# Patient Record
Sex: Male | Born: 1935
Health system: Southern US, Community
[De-identification: ages and names within clinical notes are randomized; demographics above are authoritative.]

## PROBLEM LIST (undated history)

## (undated) DIAGNOSIS — I10 Essential (primary) hypertension: Secondary | ICD-10-CM

## (undated) DIAGNOSIS — E785 Hyperlipidemia, unspecified: Secondary | ICD-10-CM

## (undated) DIAGNOSIS — I2510 Atherosclerotic heart disease of native coronary artery without angina pectoris: Secondary | ICD-10-CM

## (undated) DIAGNOSIS — J449 Chronic obstructive pulmonary disease, unspecified: Secondary | ICD-10-CM

## (undated) DIAGNOSIS — I5022 Chronic systolic (congestive) heart failure: Secondary | ICD-10-CM

## (undated) DIAGNOSIS — M6282 Rhabdomyolysis: Secondary | ICD-10-CM

## (undated) DIAGNOSIS — J45909 Unspecified asthma, uncomplicated: Secondary | ICD-10-CM

## (undated) DIAGNOSIS — E119 Type 2 diabetes mellitus without complications: Secondary | ICD-10-CM

## (undated) DIAGNOSIS — I255 Ischemic cardiomyopathy: Secondary | ICD-10-CM

## (undated) HISTORY — DX: Ischemic cardiomyopathy: I25.5

## (undated) HISTORY — DX: Unspecified asthma, uncomplicated: J45.909

## (undated) HISTORY — DX: Rhabdomyolysis: M62.82

## (undated) HISTORY — DX: Hyperlipidemia, unspecified: E78.5

## (undated) HISTORY — DX: Chronic systolic (congestive) heart failure: I50.22

## (undated) HISTORY — DX: Type 2 diabetes mellitus without complications: E11.9

## (undated) HISTORY — DX: Atherosclerotic heart disease of native coronary artery without angina pectoris: I25.10

## (undated) HISTORY — DX: Essential (primary) hypertension: I10

## (undated) HISTORY — DX: Chronic obstructive pulmonary disease, unspecified: J44.9

---

## 2008-07-27 ENCOUNTER — Emergency Department: Payer: Self-pay | Admitting: Emergency Medicine

## 2008-07-28 ENCOUNTER — Emergency Department: Payer: Self-pay | Admitting: Emergency Medicine

## 2009-05-28 ENCOUNTER — Inpatient Hospital Stay: Payer: Self-pay | Admitting: Internal Medicine

## 2010-04-02 ENCOUNTER — Ambulatory Visit: Payer: Self-pay | Admitting: Internal Medicine

## 2010-06-04 ENCOUNTER — Ambulatory Visit: Payer: Self-pay | Admitting: Gastroenterology

## 2010-06-06 LAB — PATHOLOGY REPORT

## 2010-07-30 ENCOUNTER — Ambulatory Visit: Payer: Self-pay | Admitting: Internal Medicine

## 2011-05-28 DIAGNOSIS — I2510 Atherosclerotic heart disease of native coronary artery without angina pectoris: Secondary | ICD-10-CM | POA: Diagnosis not present

## 2011-05-28 DIAGNOSIS — I5022 Chronic systolic (congestive) heart failure: Secondary | ICD-10-CM | POA: Diagnosis not present

## 2011-05-28 DIAGNOSIS — I059 Rheumatic mitral valve disease, unspecified: Secondary | ICD-10-CM | POA: Diagnosis not present

## 2011-05-28 DIAGNOSIS — E782 Mixed hyperlipidemia: Secondary | ICD-10-CM | POA: Diagnosis not present

## 2011-06-03 ENCOUNTER — Other Ambulatory Visit: Payer: Self-pay | Admitting: Orthopedic Surgery

## 2011-06-03 DIAGNOSIS — M6740 Ganglion, unspecified site: Secondary | ICD-10-CM | POA: Diagnosis not present

## 2011-06-03 DIAGNOSIS — R29898 Other symptoms and signs involving the musculoskeletal system: Secondary | ICD-10-CM | POA: Diagnosis not present

## 2011-06-03 LAB — BODY FLUID CELL COUNT WITH DIFFERENTIAL
Basophil: 0 %
Lymphocytes: 53 %
Neutrophils: 47 %

## 2011-06-03 LAB — SYNOVIAL FLUID, CRYSTAL: Crystals, Joint Fluid: NONE SEEN

## 2011-07-03 DIAGNOSIS — L03119 Cellulitis of unspecified part of limb: Secondary | ICD-10-CM | POA: Diagnosis not present

## 2011-07-03 DIAGNOSIS — L02419 Cutaneous abscess of limb, unspecified: Secondary | ICD-10-CM | POA: Diagnosis not present

## 2011-08-03 DIAGNOSIS — J449 Chronic obstructive pulmonary disease, unspecified: Secondary | ICD-10-CM | POA: Diagnosis not present

## 2011-08-03 DIAGNOSIS — I43 Cardiomyopathy in diseases classified elsewhere: Secondary | ICD-10-CM | POA: Diagnosis not present

## 2011-08-10 DIAGNOSIS — I43 Cardiomyopathy in diseases classified elsewhere: Secondary | ICD-10-CM | POA: Diagnosis not present

## 2011-08-10 DIAGNOSIS — E782 Mixed hyperlipidemia: Secondary | ICD-10-CM | POA: Diagnosis not present

## 2011-08-10 DIAGNOSIS — I10 Essential (primary) hypertension: Secondary | ICD-10-CM | POA: Diagnosis not present

## 2011-12-08 DIAGNOSIS — E559 Vitamin D deficiency, unspecified: Secondary | ICD-10-CM | POA: Diagnosis not present

## 2011-12-08 DIAGNOSIS — E119 Type 2 diabetes mellitus without complications: Secondary | ICD-10-CM | POA: Diagnosis not present

## 2011-12-08 DIAGNOSIS — I10 Essential (primary) hypertension: Secondary | ICD-10-CM | POA: Diagnosis not present

## 2011-12-08 DIAGNOSIS — I43 Cardiomyopathy in diseases classified elsewhere: Secondary | ICD-10-CM | POA: Diagnosis not present

## 2011-12-08 DIAGNOSIS — Z0000 Encounter for general adult medical examination without abnormal findings: Secondary | ICD-10-CM | POA: Diagnosis not present

## 2011-12-08 DIAGNOSIS — R5381 Other malaise: Secondary | ICD-10-CM | POA: Diagnosis not present

## 2011-12-08 DIAGNOSIS — J449 Chronic obstructive pulmonary disease, unspecified: Secondary | ICD-10-CM | POA: Diagnosis not present

## 2011-12-08 DIAGNOSIS — E782 Mixed hyperlipidemia: Secondary | ICD-10-CM | POA: Diagnosis not present

## 2011-12-16 DIAGNOSIS — E782 Mixed hyperlipidemia: Secondary | ICD-10-CM | POA: Diagnosis not present

## 2011-12-16 DIAGNOSIS — I2510 Atherosclerotic heart disease of native coronary artery without angina pectoris: Secondary | ICD-10-CM | POA: Diagnosis not present

## 2011-12-16 DIAGNOSIS — I5022 Chronic systolic (congestive) heart failure: Secondary | ICD-10-CM | POA: Diagnosis not present

## 2012-02-01 DIAGNOSIS — M545 Low back pain: Secondary | ICD-10-CM | POA: Diagnosis not present

## 2012-02-01 DIAGNOSIS — J449 Chronic obstructive pulmonary disease, unspecified: Secondary | ICD-10-CM | POA: Diagnosis not present

## 2012-02-01 DIAGNOSIS — K5909 Other constipation: Secondary | ICD-10-CM | POA: Diagnosis not present

## 2012-04-12 DIAGNOSIS — J449 Chronic obstructive pulmonary disease, unspecified: Secondary | ICD-10-CM | POA: Diagnosis not present

## 2012-04-12 DIAGNOSIS — I43 Cardiomyopathy in diseases classified elsewhere: Secondary | ICD-10-CM | POA: Diagnosis not present

## 2012-04-12 DIAGNOSIS — E119 Type 2 diabetes mellitus without complications: Secondary | ICD-10-CM | POA: Diagnosis not present

## 2012-04-12 DIAGNOSIS — E559 Vitamin D deficiency, unspecified: Secondary | ICD-10-CM | POA: Diagnosis not present

## 2012-04-12 DIAGNOSIS — R5381 Other malaise: Secondary | ICD-10-CM | POA: Diagnosis not present

## 2012-04-12 DIAGNOSIS — J309 Allergic rhinitis, unspecified: Secondary | ICD-10-CM | POA: Diagnosis not present

## 2012-04-12 DIAGNOSIS — R7989 Other specified abnormal findings of blood chemistry: Secondary | ICD-10-CM | POA: Diagnosis not present

## 2012-04-12 DIAGNOSIS — D518 Other vitamin B12 deficiency anemias: Secondary | ICD-10-CM | POA: Diagnosis not present

## 2012-04-12 DIAGNOSIS — E782 Mixed hyperlipidemia: Secondary | ICD-10-CM | POA: Diagnosis not present

## 2012-04-12 DIAGNOSIS — I2510 Atherosclerotic heart disease of native coronary artery without angina pectoris: Secondary | ICD-10-CM | POA: Diagnosis not present

## 2012-06-16 DIAGNOSIS — E782 Mixed hyperlipidemia: Secondary | ICD-10-CM | POA: Diagnosis not present

## 2012-06-16 DIAGNOSIS — R0602 Shortness of breath: Secondary | ICD-10-CM | POA: Diagnosis not present

## 2012-06-16 DIAGNOSIS — I5022 Chronic systolic (congestive) heart failure: Secondary | ICD-10-CM | POA: Diagnosis not present

## 2012-06-16 DIAGNOSIS — I2510 Atherosclerotic heart disease of native coronary artery without angina pectoris: Secondary | ICD-10-CM | POA: Diagnosis not present

## 2012-06-23 DIAGNOSIS — I5020 Unspecified systolic (congestive) heart failure: Secondary | ICD-10-CM | POA: Diagnosis not present

## 2012-08-08 DIAGNOSIS — Z006 Encounter for examination for normal comparison and control in clinical research program: Secondary | ICD-10-CM | POA: Diagnosis not present

## 2012-08-08 DIAGNOSIS — E119 Type 2 diabetes mellitus without complications: Secondary | ICD-10-CM | POA: Diagnosis not present

## 2012-08-08 DIAGNOSIS — R05 Cough: Secondary | ICD-10-CM | POA: Diagnosis not present

## 2012-08-08 DIAGNOSIS — R0602 Shortness of breath: Secondary | ICD-10-CM | POA: Diagnosis not present

## 2012-08-08 DIAGNOSIS — R238 Other skin changes: Secondary | ICD-10-CM | POA: Diagnosis not present

## 2012-08-08 DIAGNOSIS — R209 Unspecified disturbances of skin sensation: Secondary | ICD-10-CM | POA: Diagnosis not present

## 2012-08-08 DIAGNOSIS — J449 Chronic obstructive pulmonary disease, unspecified: Secondary | ICD-10-CM | POA: Diagnosis not present

## 2012-08-08 DIAGNOSIS — M79609 Pain in unspecified limb: Secondary | ICD-10-CM | POA: Diagnosis not present

## 2012-08-08 DIAGNOSIS — M25569 Pain in unspecified knee: Secondary | ICD-10-CM | POA: Diagnosis not present

## 2012-08-09 ENCOUNTER — Ambulatory Visit: Payer: Self-pay | Admitting: Physician Assistant

## 2012-08-09 DIAGNOSIS — IMO0002 Reserved for concepts with insufficient information to code with codable children: Secondary | ICD-10-CM | POA: Diagnosis not present

## 2012-08-09 DIAGNOSIS — M1710 Unilateral primary osteoarthritis, unspecified knee: Secondary | ICD-10-CM | POA: Diagnosis not present

## 2012-08-09 DIAGNOSIS — R0602 Shortness of breath: Secondary | ICD-10-CM | POA: Diagnosis not present

## 2012-08-09 DIAGNOSIS — J9819 Other pulmonary collapse: Secondary | ICD-10-CM | POA: Diagnosis not present

## 2012-08-09 DIAGNOSIS — M25569 Pain in unspecified knee: Secondary | ICD-10-CM | POA: Diagnosis not present

## 2012-08-10 DIAGNOSIS — E119 Type 2 diabetes mellitus without complications: Secondary | ICD-10-CM | POA: Diagnosis not present

## 2012-08-10 DIAGNOSIS — M25569 Pain in unspecified knee: Secondary | ICD-10-CM | POA: Diagnosis not present

## 2012-08-10 DIAGNOSIS — E559 Vitamin D deficiency, unspecified: Secondary | ICD-10-CM | POA: Diagnosis not present

## 2012-08-10 DIAGNOSIS — I2510 Atherosclerotic heart disease of native coronary artery without angina pectoris: Secondary | ICD-10-CM | POA: Diagnosis not present

## 2012-08-10 DIAGNOSIS — J309 Allergic rhinitis, unspecified: Secondary | ICD-10-CM | POA: Diagnosis not present

## 2012-08-10 DIAGNOSIS — J449 Chronic obstructive pulmonary disease, unspecified: Secondary | ICD-10-CM | POA: Diagnosis not present

## 2012-08-10 DIAGNOSIS — I10 Essential (primary) hypertension: Secondary | ICD-10-CM | POA: Diagnosis not present

## 2012-08-10 DIAGNOSIS — M1710 Unilateral primary osteoarthritis, unspecified knee: Secondary | ICD-10-CM | POA: Diagnosis not present

## 2012-08-17 DIAGNOSIS — M1710 Unilateral primary osteoarthritis, unspecified knee: Secondary | ICD-10-CM | POA: Diagnosis not present

## 2012-08-29 DIAGNOSIS — K219 Gastro-esophageal reflux disease without esophagitis: Secondary | ICD-10-CM | POA: Diagnosis not present

## 2012-08-29 DIAGNOSIS — J441 Chronic obstructive pulmonary disease with (acute) exacerbation: Secondary | ICD-10-CM | POA: Diagnosis not present

## 2012-08-29 DIAGNOSIS — J449 Chronic obstructive pulmonary disease, unspecified: Secondary | ICD-10-CM | POA: Diagnosis not present

## 2012-08-29 DIAGNOSIS — I10 Essential (primary) hypertension: Secondary | ICD-10-CM | POA: Diagnosis not present

## 2012-08-29 DIAGNOSIS — I6529 Occlusion and stenosis of unspecified carotid artery: Secondary | ICD-10-CM | POA: Diagnosis not present

## 2012-08-29 DIAGNOSIS — E785 Hyperlipidemia, unspecified: Secondary | ICD-10-CM | POA: Diagnosis not present

## 2012-08-29 DIAGNOSIS — E119 Type 2 diabetes mellitus without complications: Secondary | ICD-10-CM | POA: Diagnosis not present

## 2012-10-12 DIAGNOSIS — Z79899 Other long term (current) drug therapy: Secondary | ICD-10-CM | POA: Diagnosis not present

## 2012-10-12 DIAGNOSIS — E119 Type 2 diabetes mellitus without complications: Secondary | ICD-10-CM | POA: Diagnosis not present

## 2012-10-12 DIAGNOSIS — J438 Other emphysema: Secondary | ICD-10-CM | POA: Diagnosis not present

## 2012-10-12 DIAGNOSIS — I10 Essential (primary) hypertension: Secondary | ICD-10-CM | POA: Diagnosis not present

## 2012-10-12 DIAGNOSIS — R0602 Shortness of breath: Secondary | ICD-10-CM | POA: Diagnosis not present

## 2012-10-12 DIAGNOSIS — Z125 Encounter for screening for malignant neoplasm of prostate: Secondary | ICD-10-CM | POA: Diagnosis not present

## 2012-10-12 DIAGNOSIS — E782 Mixed hyperlipidemia: Secondary | ICD-10-CM | POA: Diagnosis not present

## 2012-12-09 DIAGNOSIS — B359 Dermatophytosis, unspecified: Secondary | ICD-10-CM | POA: Diagnosis not present

## 2012-12-09 DIAGNOSIS — E782 Mixed hyperlipidemia: Secondary | ICD-10-CM | POA: Diagnosis not present

## 2012-12-09 DIAGNOSIS — E1149 Type 2 diabetes mellitus with other diabetic neurological complication: Secondary | ICD-10-CM | POA: Diagnosis not present

## 2012-12-09 DIAGNOSIS — I10 Essential (primary) hypertension: Secondary | ICD-10-CM | POA: Diagnosis not present

## 2012-12-09 DIAGNOSIS — E559 Vitamin D deficiency, unspecified: Secondary | ICD-10-CM | POA: Diagnosis not present

## 2012-12-09 DIAGNOSIS — J449 Chronic obstructive pulmonary disease, unspecified: Secondary | ICD-10-CM | POA: Diagnosis not present

## 2012-12-12 DIAGNOSIS — E1149 Type 2 diabetes mellitus with other diabetic neurological complication: Secondary | ICD-10-CM | POA: Diagnosis not present

## 2012-12-12 DIAGNOSIS — B351 Tinea unguium: Secondary | ICD-10-CM | POA: Diagnosis not present

## 2012-12-12 DIAGNOSIS — L851 Acquired keratosis [keratoderma] palmaris et plantaris: Secondary | ICD-10-CM | POA: Diagnosis not present

## 2012-12-12 DIAGNOSIS — G909 Disorder of the autonomic nervous system, unspecified: Secondary | ICD-10-CM | POA: Diagnosis not present

## 2013-01-25 DIAGNOSIS — R7989 Other specified abnormal findings of blood chemistry: Secondary | ICD-10-CM | POA: Diagnosis not present

## 2013-02-06 DIAGNOSIS — J449 Chronic obstructive pulmonary disease, unspecified: Secondary | ICD-10-CM | POA: Diagnosis not present

## 2013-02-06 DIAGNOSIS — J438 Other emphysema: Secondary | ICD-10-CM | POA: Diagnosis not present

## 2013-02-06 DIAGNOSIS — B359 Dermatophytosis, unspecified: Secondary | ICD-10-CM | POA: Diagnosis not present

## 2013-04-16 IMAGING — CR DG CHEST 2V
1 series · 3 of 3 positions shown · non-contrast
Comparison: none

REASON FOR EXAM: shortness of breath
COMMENTS:

PROCEDURE:     KDR - KDXR CHEST PA (OR AP) AND LAT  - August 09, 2012  [DATE]
RESULT:     Comparison: 07/30/2010

[Series 1: pa · 0.17mm/px · 3 of 3 slices shown]
[im 1/3]
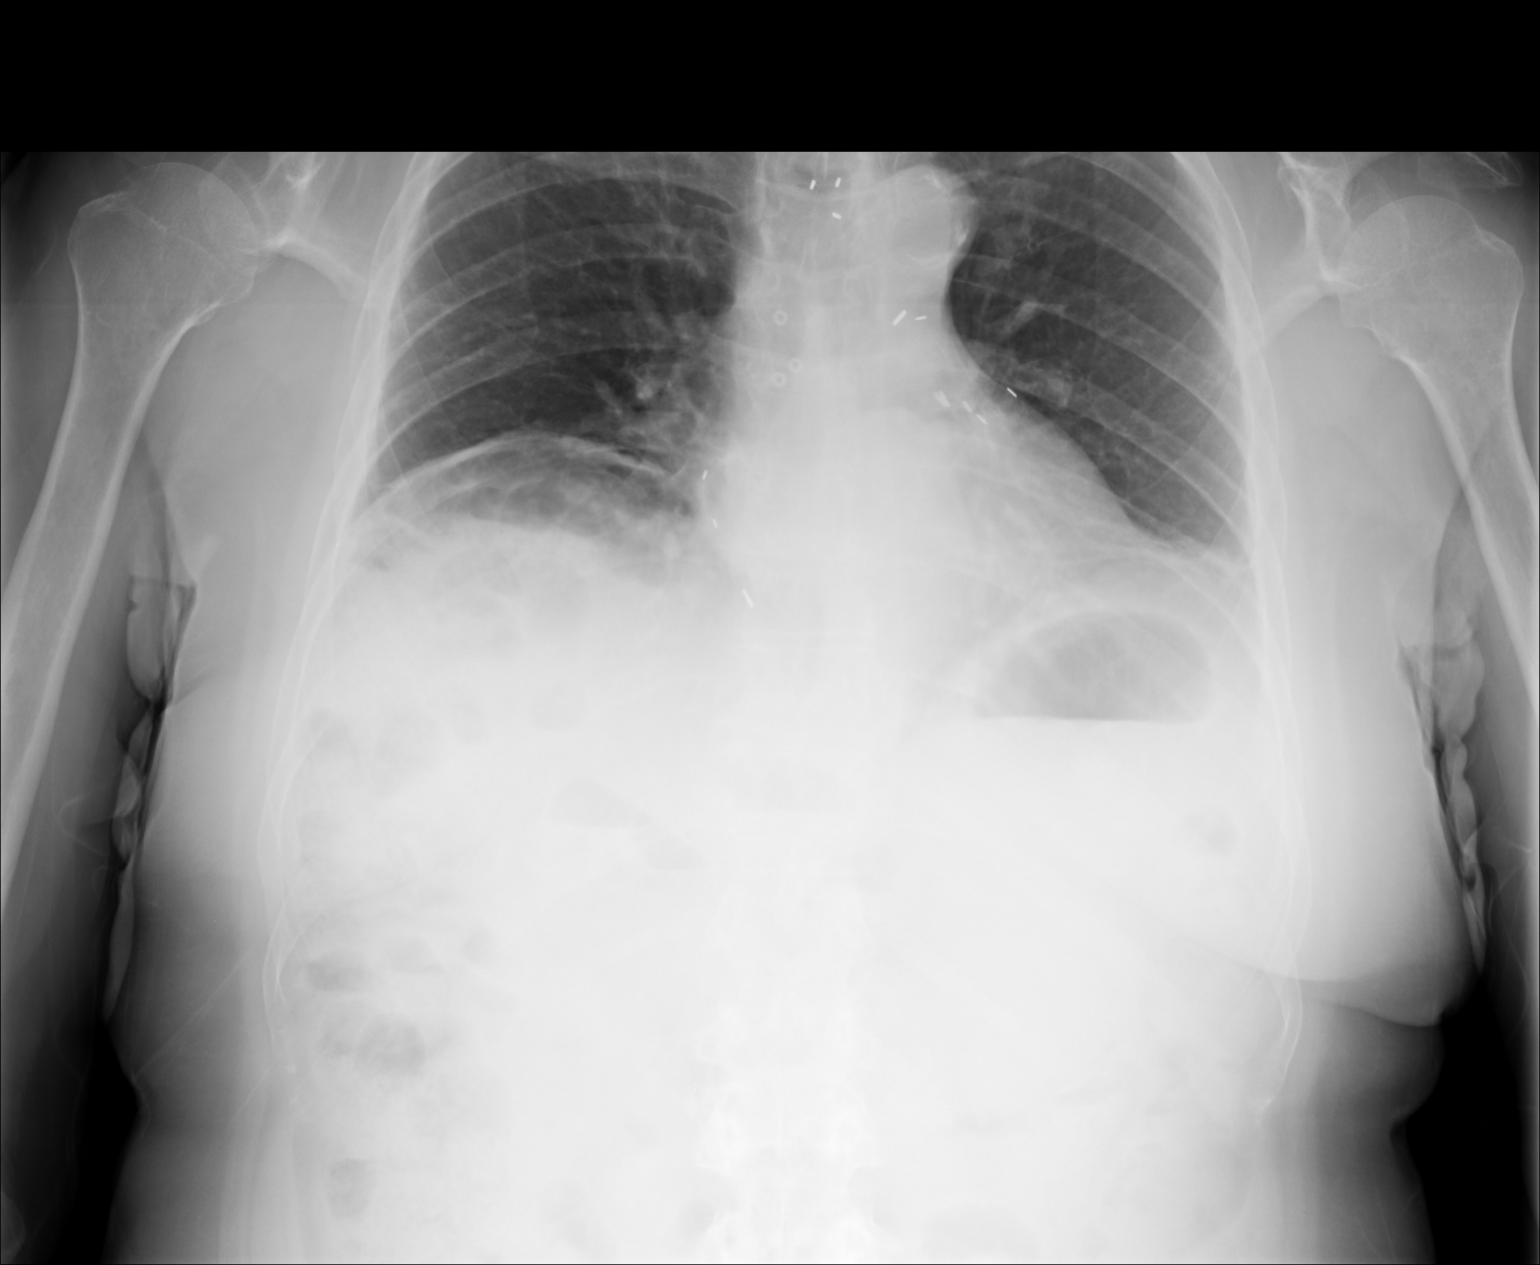
[im 2/3]
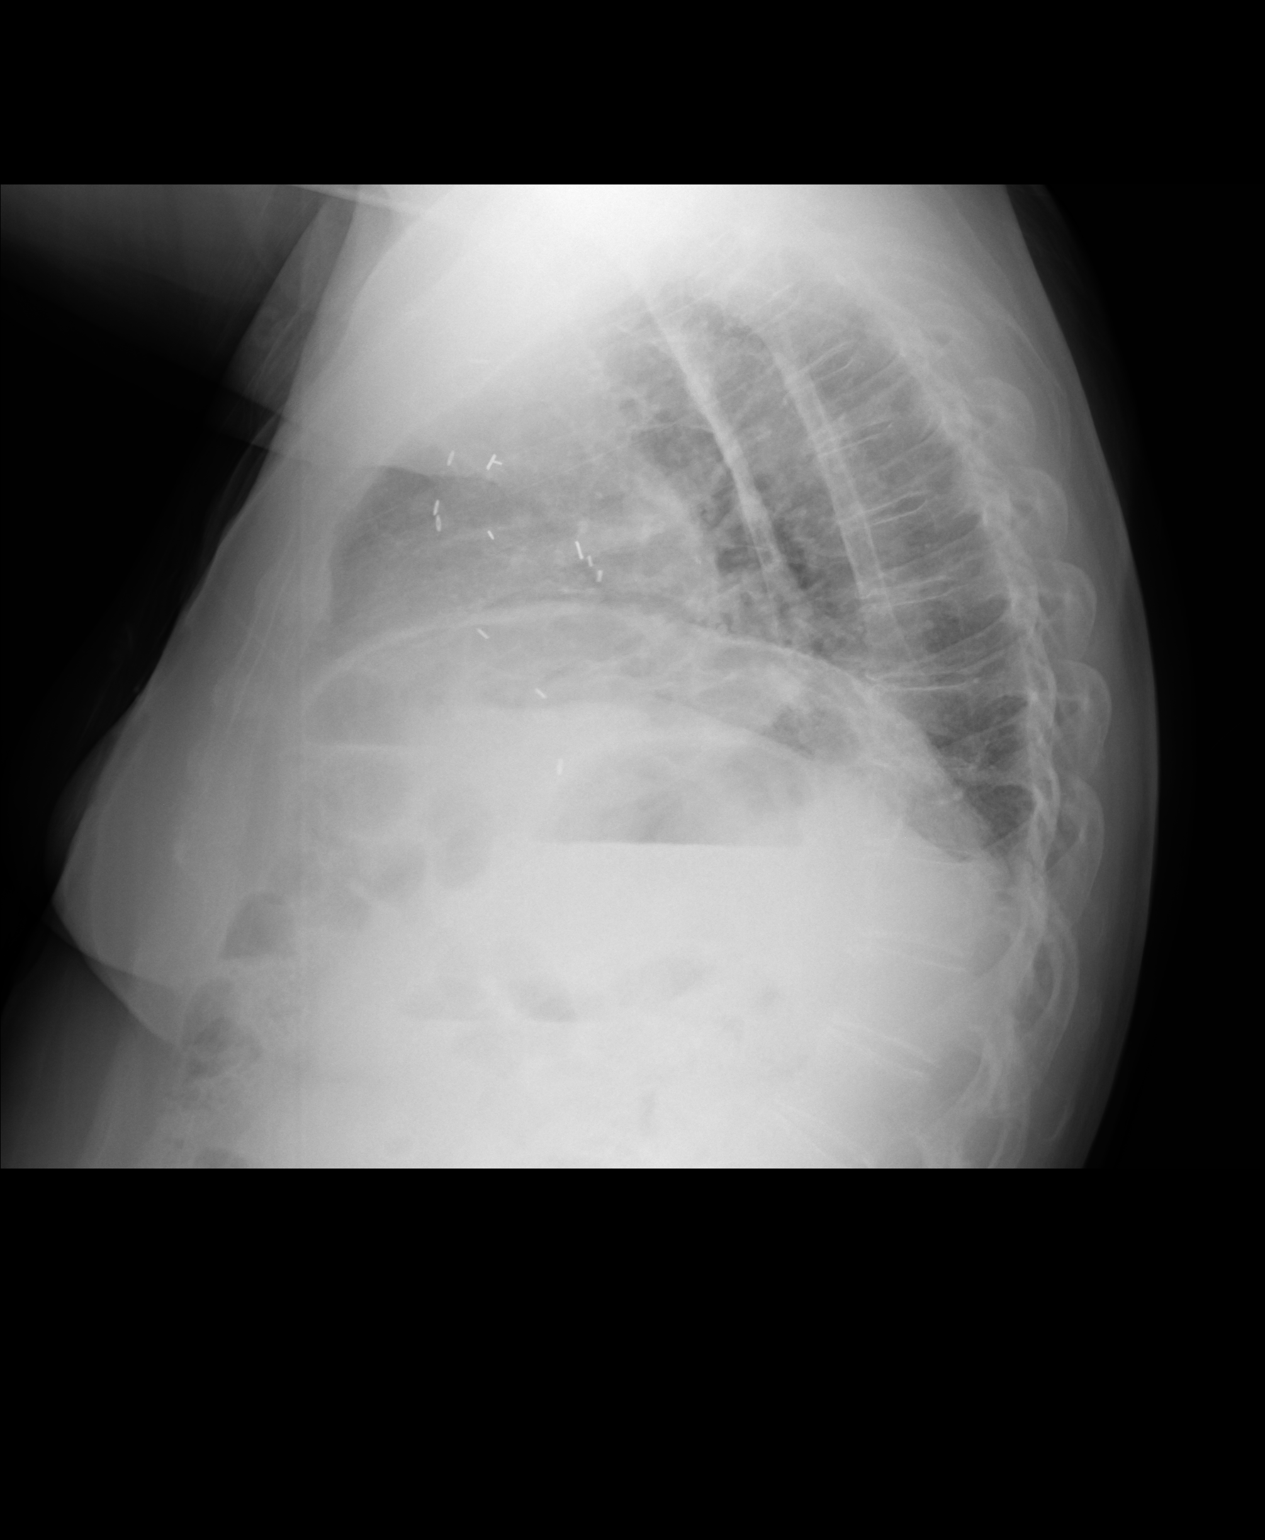
[im 3/3]
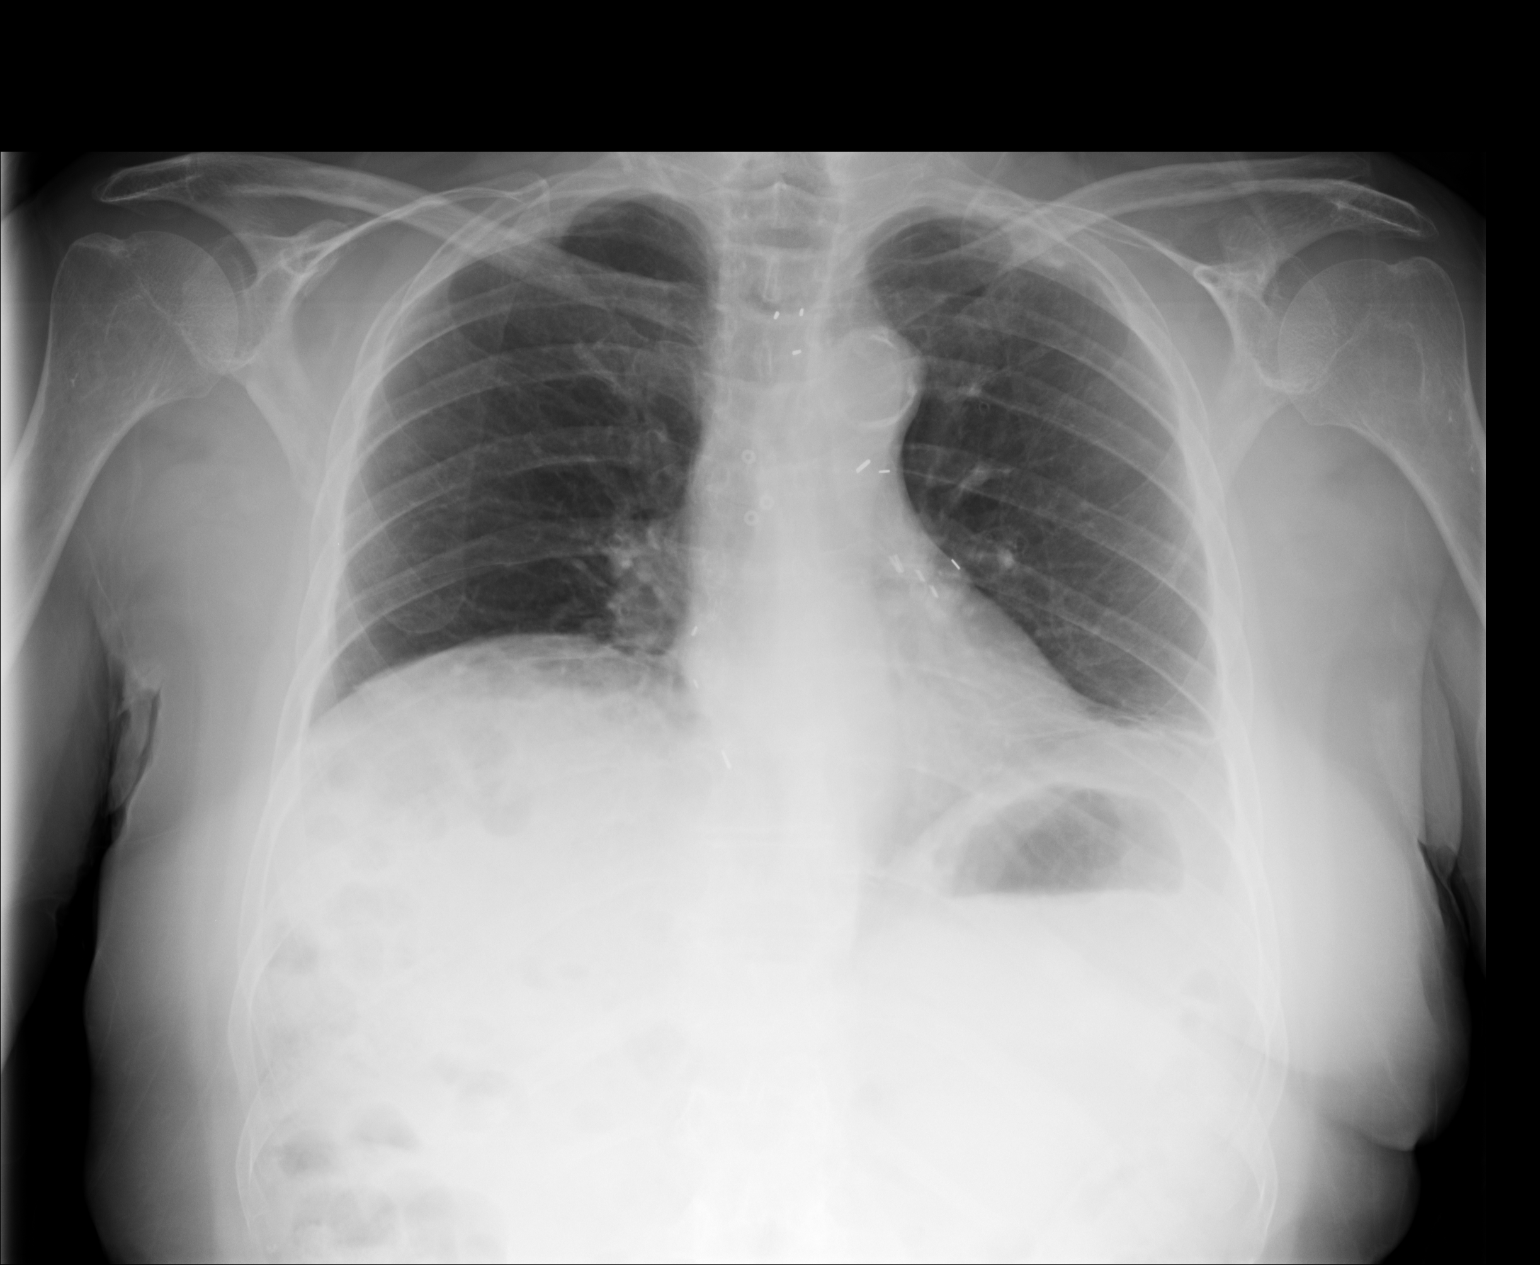

[3 of 3 positions shown; findings below may reference images not displayed]

FINDINGS: PA and lateral chest radiographs are provided. No low lung volumes. There is
left basilar atelectasis. There is evidence of prior CABG. There is no focal
parenchymal opacity, pleural effusion, or pneumothorax. The heart and
mediastinum are unremarkable.  The osseous structures are unremarkable.
IMPRESSION: No acute disease of the che[REDACTED]

## 2013-08-07 DIAGNOSIS — J438 Other emphysema: Secondary | ICD-10-CM | POA: Diagnosis not present

## 2013-08-07 DIAGNOSIS — J449 Chronic obstructive pulmonary disease, unspecified: Secondary | ICD-10-CM | POA: Diagnosis not present

## 2013-09-12 DIAGNOSIS — I428 Other cardiomyopathies: Secondary | ICD-10-CM | POA: Diagnosis not present

## 2013-09-12 DIAGNOSIS — Z79899 Other long term (current) drug therapy: Secondary | ICD-10-CM | POA: Diagnosis not present

## 2013-09-12 DIAGNOSIS — E039 Hypothyroidism, unspecified: Secondary | ICD-10-CM | POA: Diagnosis not present

## 2013-09-12 DIAGNOSIS — E119 Type 2 diabetes mellitus without complications: Secondary | ICD-10-CM | POA: Diagnosis not present

## 2013-09-12 DIAGNOSIS — E782 Mixed hyperlipidemia: Secondary | ICD-10-CM | POA: Diagnosis not present

## 2013-09-12 DIAGNOSIS — E1149 Type 2 diabetes mellitus with other diabetic neurological complication: Secondary | ICD-10-CM | POA: Diagnosis not present

## 2013-09-12 DIAGNOSIS — J449 Chronic obstructive pulmonary disease, unspecified: Secondary | ICD-10-CM | POA: Diagnosis not present

## 2013-09-12 DIAGNOSIS — I10 Essential (primary) hypertension: Secondary | ICD-10-CM | POA: Diagnosis not present

## 2013-09-12 DIAGNOSIS — B351 Tinea unguium: Secondary | ICD-10-CM | POA: Diagnosis not present

## 2013-10-27 DIAGNOSIS — B351 Tinea unguium: Secondary | ICD-10-CM | POA: Diagnosis not present

## 2013-10-27 DIAGNOSIS — Z79899 Other long term (current) drug therapy: Secondary | ICD-10-CM | POA: Diagnosis not present

## 2013-10-27 DIAGNOSIS — E1149 Type 2 diabetes mellitus with other diabetic neurological complication: Secondary | ICD-10-CM | POA: Diagnosis not present

## 2013-11-04 ENCOUNTER — Emergency Department: Payer: Self-pay | Admitting: Emergency Medicine

## 2013-11-04 DIAGNOSIS — S63509A Unspecified sprain of unspecified wrist, initial encounter: Secondary | ICD-10-CM | POA: Diagnosis not present

## 2013-11-04 DIAGNOSIS — M25519 Pain in unspecified shoulder: Secondary | ICD-10-CM | POA: Diagnosis not present

## 2013-11-04 DIAGNOSIS — S46909A Unspecified injury of unspecified muscle, fascia and tendon at shoulder and upper arm level, unspecified arm, initial encounter: Secondary | ICD-10-CM | POA: Diagnosis not present

## 2013-11-04 DIAGNOSIS — E785 Hyperlipidemia, unspecified: Secondary | ICD-10-CM | POA: Diagnosis not present

## 2013-11-04 DIAGNOSIS — M25539 Pain in unspecified wrist: Secondary | ICD-10-CM | POA: Diagnosis not present

## 2013-11-04 DIAGNOSIS — Z79899 Other long term (current) drug therapy: Secondary | ICD-10-CM | POA: Diagnosis not present

## 2013-11-04 DIAGNOSIS — S4980XA Other specified injuries of shoulder and upper arm, unspecified arm, initial encounter: Secondary | ICD-10-CM | POA: Diagnosis not present

## 2013-11-04 DIAGNOSIS — S59909A Unspecified injury of unspecified elbow, initial encounter: Secondary | ICD-10-CM | POA: Diagnosis not present

## 2013-11-04 DIAGNOSIS — Z760 Encounter for issue of repeat prescription: Secondary | ICD-10-CM | POA: Diagnosis not present

## 2013-11-04 DIAGNOSIS — I10 Essential (primary) hypertension: Secondary | ICD-10-CM | POA: Diagnosis not present

## 2013-11-04 DIAGNOSIS — S40019A Contusion of unspecified shoulder, initial encounter: Secondary | ICD-10-CM | POA: Diagnosis not present

## 2013-12-05 DIAGNOSIS — J438 Other emphysema: Secondary | ICD-10-CM | POA: Diagnosis not present

## 2013-12-05 DIAGNOSIS — J449 Chronic obstructive pulmonary disease, unspecified: Secondary | ICD-10-CM | POA: Diagnosis not present

## 2013-12-15 DIAGNOSIS — Z79899 Other long term (current) drug therapy: Secondary | ICD-10-CM | POA: Diagnosis not present

## 2013-12-15 DIAGNOSIS — E1149 Type 2 diabetes mellitus with other diabetic neurological complication: Secondary | ICD-10-CM | POA: Diagnosis not present

## 2013-12-15 DIAGNOSIS — B351 Tinea unguium: Secondary | ICD-10-CM | POA: Diagnosis not present

## 2014-02-08 ENCOUNTER — Encounter: Payer: Self-pay | Admitting: Nurse Practitioner

## 2014-02-08 ENCOUNTER — Other Ambulatory Visit: Payer: Self-pay | Admitting: Nurse Practitioner

## 2014-02-08 ENCOUNTER — Inpatient Hospital Stay: Payer: Self-pay | Admitting: Internal Medicine

## 2014-02-08 DIAGNOSIS — I248 Other forms of acute ischemic heart disease: Secondary | ICD-10-CM | POA: Diagnosis present

## 2014-02-08 DIAGNOSIS — I4729 Other ventricular tachycardia: Secondary | ICD-10-CM | POA: Diagnosis not present

## 2014-02-08 DIAGNOSIS — J209 Acute bronchitis, unspecified: Secondary | ICD-10-CM | POA: Diagnosis present

## 2014-02-08 DIAGNOSIS — I2589 Other forms of chronic ischemic heart disease: Secondary | ICD-10-CM | POA: Diagnosis present

## 2014-02-08 DIAGNOSIS — R059 Cough, unspecified: Secondary | ICD-10-CM | POA: Diagnosis not present

## 2014-02-08 DIAGNOSIS — E119 Type 2 diabetes mellitus without complications: Secondary | ICD-10-CM | POA: Diagnosis not present

## 2014-02-08 DIAGNOSIS — J45901 Unspecified asthma with (acute) exacerbation: Secondary | ICD-10-CM | POA: Diagnosis not present

## 2014-02-08 DIAGNOSIS — I10 Essential (primary) hypertension: Secondary | ICD-10-CM | POA: Diagnosis not present

## 2014-02-08 DIAGNOSIS — R748 Abnormal levels of other serum enzymes: Secondary | ICD-10-CM | POA: Diagnosis not present

## 2014-02-08 DIAGNOSIS — E785 Hyperlipidemia, unspecified: Secondary | ICD-10-CM | POA: Diagnosis present

## 2014-02-08 DIAGNOSIS — R350 Frequency of micturition: Secondary | ICD-10-CM | POA: Diagnosis present

## 2014-02-08 DIAGNOSIS — Z951 Presence of aortocoronary bypass graft: Secondary | ICD-10-CM | POA: Diagnosis not present

## 2014-02-08 DIAGNOSIS — I472 Ventricular tachycardia, unspecified: Secondary | ICD-10-CM | POA: Diagnosis not present

## 2014-02-08 DIAGNOSIS — I509 Heart failure, unspecified: Secondary | ICD-10-CM | POA: Diagnosis not present

## 2014-02-08 DIAGNOSIS — R0602 Shortness of breath: Secondary | ICD-10-CM | POA: Diagnosis not present

## 2014-02-08 DIAGNOSIS — Z7982 Long term (current) use of aspirin: Secondary | ICD-10-CM | POA: Diagnosis not present

## 2014-02-08 DIAGNOSIS — I5021 Acute systolic (congestive) heart failure: Secondary | ICD-10-CM | POA: Diagnosis present

## 2014-02-08 DIAGNOSIS — R05 Cough: Secondary | ICD-10-CM | POA: Diagnosis not present

## 2014-02-08 DIAGNOSIS — R7989 Other specified abnormal findings of blood chemistry: Secondary | ICD-10-CM | POA: Diagnosis not present

## 2014-02-08 LAB — URINALYSIS, COMPLETE
BACTERIA: NONE SEEN
BILIRUBIN, UR: NEGATIVE
BLOOD: NEGATIVE
Glucose,UR: NEGATIVE mg/dL (ref 0–75)
Ketone: NEGATIVE
Leukocyte Esterase: NEGATIVE
Nitrite: NEGATIVE
PROTEIN: NEGATIVE
Ph: 7 (ref 4.5–8.0)
SQUAMOUS EPITHELIAL: NONE SEEN
Specific Gravity: 1.005 (ref 1.003–1.030)
WBC UR: <1 /HPF (ref 0–5)

## 2014-02-08 LAB — BASIC METABOLIC PANEL
ANION GAP: 4 — AB (ref 7–16)
BUN: 11 mg/dL (ref 7–18)
Calcium, Total: 9 mg/dL (ref 8.5–10.1)
Chloride: 103 mmol/L (ref 98–107)
Co2: 28 mmol/L (ref 21–32)
Creatinine: 1.04 mg/dL (ref 0.60–1.30)
EGFR (Non-African Amer.): >60
Glucose: 68 mg/dL (ref 65–99)
Osmolality: 268 (ref 275–301)
Potassium: 4.2 mmol/L (ref 3.5–5.1)
Sodium: 135 mmol/L — ABNORMAL LOW (ref 136–145)

## 2014-02-08 LAB — CK TOTAL AND CKMB (NOT AT ARMC)
CK, Total: 312 U/L — ABNORMAL HIGH
CK, Total: 306 U/L
CK, Total: 338 U/L — ABNORMAL HIGH
CK-MB: 4.5 ng/mL — ABNORMAL HIGH (ref 0.5–3.6)
CK-MB: 4.4 ng/mL — ABNORMAL HIGH (ref 0.5–3.6)
CK-MB: 5 ng/mL — AB (ref 0.5–3.6)

## 2014-02-08 LAB — CBC
HCT: 34 % — ABNORMAL LOW (ref 40.0–52.0)
HGB: 11.2 g/dL — ABNORMAL LOW (ref 13.0–18.0)
MCH: 28.9 pg (ref 26.0–34.0)
MCHC: 33.1 g/dL (ref 32.0–36.0)
MCV: 87 fL (ref 80–100)
PLATELETS: 177 10*3/uL (ref 150–440)
RBC: 3.89 10*6/uL — ABNORMAL LOW (ref 4.40–5.90)
RDW: 17.7 % — ABNORMAL HIGH (ref 11.5–14.5)
WBC: 5.9 10*3/uL (ref 3.8–10.6)

## 2014-02-08 LAB — MAGNESIUM: MAGNESIUM: 2.1 mg/dL

## 2014-02-08 LAB — TROPONIN I
Troponin-I: 0.07 ng/mL — ABNORMAL HIGH
Troponin-I: 0.05 ng/mL
Troponin-I: 0.04 ng/mL

## 2014-02-09 DIAGNOSIS — E119 Type 2 diabetes mellitus without complications: Secondary | ICD-10-CM | POA: Diagnosis not present

## 2014-02-09 DIAGNOSIS — I472 Ventricular tachycardia: Secondary | ICD-10-CM | POA: Diagnosis not present

## 2014-02-09 DIAGNOSIS — R7989 Other specified abnormal findings of blood chemistry: Secondary | ICD-10-CM | POA: Diagnosis not present

## 2014-02-09 DIAGNOSIS — I509 Heart failure, unspecified: Secondary | ICD-10-CM | POA: Diagnosis not present

## 2014-02-09 DIAGNOSIS — J45901 Unspecified asthma with (acute) exacerbation: Secondary | ICD-10-CM | POA: Diagnosis not present

## 2014-02-09 LAB — CBC WITH DIFFERENTIAL/PLATELET
BASOS ABS: 0 10*3/uL (ref 0.0–0.1)
Basophil %: 0.2 %
Eosinophil #: 0 10*3/uL (ref 0.0–0.7)
Eosinophil %: 0 %
HCT: 35.4 % — ABNORMAL LOW (ref 40.0–52.0)
HGB: 11.3 g/dL — AB (ref 13.0–18.0)
LYMPHS PCT: 14.4 %
Lymphocyte #: 0.9 10*3/uL — ABNORMAL LOW (ref 1.0–3.6)
MCH: 28.2 pg (ref 26.0–34.0)
MCHC: 31.9 g/dL — ABNORMAL LOW (ref 32.0–36.0)
MCV: 89 fL (ref 80–100)
Monocyte #: 0.2 x10 3/mm (ref 0.2–1.0)
Monocyte %: 2.8 %
Neutrophil #: 5.5 10*3/uL (ref 1.4–6.5)
Neutrophil %: 82.6 %
Platelet: 191 10*3/uL (ref 150–440)
RBC: 4 10*6/uL — AB (ref 4.40–5.90)
RDW: 17.6 % — ABNORMAL HIGH (ref 11.5–14.5)
WBC: 6.6 10*3/uL (ref 3.8–10.6)

## 2014-02-09 LAB — LIPID PANEL
Cholesterol: 127 mg/dL (ref 0–200)
HDL: 79 mg/dL — AB (ref 40–60)
LDL CHOLESTEROL, CALC: 38 mg/dL (ref 0–100)
Triglycerides: 48 mg/dL (ref 0–200)
VLDL Cholesterol, Calc: 10 mg/dL (ref 5–40)

## 2014-02-09 LAB — BASIC METABOLIC PANEL
ANION GAP: 10 (ref 7–16)
BUN: 18 mg/dL (ref 7–18)
CHLORIDE: 99 mmol/L (ref 98–107)
Calcium, Total: 9.2 mg/dL (ref 8.5–10.1)
Co2: 24 mmol/L (ref 21–32)
Creatinine: 1.12 mg/dL (ref 0.60–1.30)
EGFR (African American): >60
EGFR (Non-African Amer.): 59 — ABNORMAL LOW
Glucose: 107 mg/dL — ABNORMAL HIGH (ref 65–99)
Osmolality: 269 (ref 275–301)
Potassium: 4.4 mmol/L (ref 3.5–5.1)
Sodium: 133 mmol/L — ABNORMAL LOW (ref 136–145)

## 2014-02-09 LAB — TSH: THYROID STIMULATING HORM: 1.43 u[IU]/mL

## 2014-02-12 DIAGNOSIS — E785 Hyperlipidemia, unspecified: Secondary | ICD-10-CM | POA: Diagnosis not present

## 2014-02-12 DIAGNOSIS — I25810 Atherosclerosis of coronary artery bypass graft(s) without angina pectoris: Secondary | ICD-10-CM | POA: Diagnosis not present

## 2014-02-12 DIAGNOSIS — I428 Other cardiomyopathies: Secondary | ICD-10-CM | POA: Diagnosis not present

## 2014-02-12 DIAGNOSIS — I2510 Atherosclerotic heart disease of native coronary artery without angina pectoris: Secondary | ICD-10-CM | POA: Diagnosis not present

## 2014-02-12 DIAGNOSIS — I509 Heart failure, unspecified: Secondary | ICD-10-CM | POA: Diagnosis not present

## 2014-02-12 DIAGNOSIS — I10 Essential (primary) hypertension: Secondary | ICD-10-CM | POA: Diagnosis not present

## 2014-02-12 LAB — PSA: PSA: 0.1 ng/mL (ref 0.0–4.0)

## 2014-02-15 DIAGNOSIS — R350 Frequency of micturition: Secondary | ICD-10-CM | POA: Diagnosis not present

## 2014-02-15 DIAGNOSIS — I10 Essential (primary) hypertension: Secondary | ICD-10-CM | POA: Diagnosis not present

## 2014-02-15 DIAGNOSIS — J449 Chronic obstructive pulmonary disease, unspecified: Secondary | ICD-10-CM | POA: Diagnosis not present

## 2014-02-15 DIAGNOSIS — R5381 Other malaise: Secondary | ICD-10-CM | POA: Diagnosis not present

## 2014-02-15 DIAGNOSIS — E119 Type 2 diabetes mellitus without complications: Secondary | ICD-10-CM | POA: Diagnosis not present

## 2014-02-15 DIAGNOSIS — N390 Urinary tract infection, site not specified: Secondary | ICD-10-CM | POA: Diagnosis not present

## 2014-02-15 DIAGNOSIS — I2510 Atherosclerotic heart disease of native coronary artery without angina pectoris: Secondary | ICD-10-CM | POA: Diagnosis not present

## 2014-02-21 DIAGNOSIS — R0602 Shortness of breath: Secondary | ICD-10-CM | POA: Diagnosis not present

## 2014-02-22 DIAGNOSIS — R339 Retention of urine, unspecified: Secondary | ICD-10-CM | POA: Diagnosis not present

## 2014-02-22 DIAGNOSIS — N400 Enlarged prostate without lower urinary tract symptoms: Secondary | ICD-10-CM | POA: Diagnosis not present

## 2014-02-22 DIAGNOSIS — N471 Phimosis: Secondary | ICD-10-CM | POA: Diagnosis not present

## 2014-02-22 DIAGNOSIS — R350 Frequency of micturition: Secondary | ICD-10-CM | POA: Diagnosis not present

## 2014-03-01 DIAGNOSIS — I25810 Atherosclerosis of coronary artery bypass graft(s) without angina pectoris: Secondary | ICD-10-CM | POA: Diagnosis not present

## 2014-03-01 DIAGNOSIS — I2510 Atherosclerotic heart disease of native coronary artery without angina pectoris: Secondary | ICD-10-CM | POA: Diagnosis not present

## 2014-03-01 DIAGNOSIS — E785 Hyperlipidemia, unspecified: Secondary | ICD-10-CM | POA: Diagnosis not present

## 2014-03-01 DIAGNOSIS — I10 Essential (primary) hypertension: Secondary | ICD-10-CM | POA: Diagnosis not present

## 2014-03-01 DIAGNOSIS — I255 Ischemic cardiomyopathy: Secondary | ICD-10-CM | POA: Diagnosis not present

## 2014-03-01 DIAGNOSIS — I509 Heart failure, unspecified: Secondary | ICD-10-CM | POA: Diagnosis not present

## 2014-03-06 DIAGNOSIS — B370 Candidal stomatitis: Secondary | ICD-10-CM | POA: Diagnosis not present

## 2014-03-06 DIAGNOSIS — E119 Type 2 diabetes mellitus without complications: Secondary | ICD-10-CM | POA: Diagnosis not present

## 2014-03-06 DIAGNOSIS — R5381 Other malaise: Secondary | ICD-10-CM | POA: Diagnosis not present

## 2014-03-06 DIAGNOSIS — J449 Chronic obstructive pulmonary disease, unspecified: Secondary | ICD-10-CM | POA: Diagnosis not present

## 2014-03-06 DIAGNOSIS — R531 Weakness: Secondary | ICD-10-CM | POA: Diagnosis not present

## 2014-03-08 DIAGNOSIS — D649 Anemia, unspecified: Secondary | ICD-10-CM | POA: Diagnosis not present

## 2014-03-08 DIAGNOSIS — B370 Candidal stomatitis: Secondary | ICD-10-CM | POA: Diagnosis not present

## 2014-03-08 DIAGNOSIS — E119 Type 2 diabetes mellitus without complications: Secondary | ICD-10-CM | POA: Diagnosis not present

## 2014-03-08 DIAGNOSIS — R0602 Shortness of breath: Secondary | ICD-10-CM | POA: Diagnosis not present

## 2014-03-08 DIAGNOSIS — D510 Vitamin B12 deficiency anemia due to intrinsic factor deficiency: Secondary | ICD-10-CM | POA: Diagnosis not present

## 2014-03-08 DIAGNOSIS — J309 Allergic rhinitis, unspecified: Secondary | ICD-10-CM | POA: Diagnosis not present

## 2014-03-08 DIAGNOSIS — I429 Cardiomyopathy, unspecified: Secondary | ICD-10-CM | POA: Diagnosis not present

## 2014-03-08 DIAGNOSIS — I420 Dilated cardiomyopathy: Secondary | ICD-10-CM | POA: Diagnosis not present

## 2014-03-08 DIAGNOSIS — R0682 Tachypnea, not elsewhere classified: Secondary | ICD-10-CM | POA: Diagnosis not present

## 2014-03-08 DIAGNOSIS — D508 Other iron deficiency anemias: Secondary | ICD-10-CM | POA: Diagnosis not present

## 2014-03-08 DIAGNOSIS — R350 Frequency of micturition: Secondary | ICD-10-CM | POA: Diagnosis not present

## 2014-03-08 DIAGNOSIS — Z79899 Other long term (current) drug therapy: Secondary | ICD-10-CM | POA: Diagnosis not present

## 2014-03-15 DIAGNOSIS — R5381 Other malaise: Secondary | ICD-10-CM | POA: Diagnosis not present

## 2014-03-15 DIAGNOSIS — I429 Cardiomyopathy, unspecified: Secondary | ICD-10-CM | POA: Diagnosis not present

## 2014-03-15 DIAGNOSIS — I2584 Coronary atherosclerosis due to calcified coronary lesion: Secondary | ICD-10-CM | POA: Diagnosis not present

## 2014-03-15 DIAGNOSIS — K5909 Other constipation: Secondary | ICD-10-CM | POA: Diagnosis not present

## 2014-03-15 DIAGNOSIS — E119 Type 2 diabetes mellitus without complications: Secondary | ICD-10-CM | POA: Diagnosis not present

## 2014-03-15 DIAGNOSIS — R531 Weakness: Secondary | ICD-10-CM | POA: Diagnosis not present

## 2014-03-15 DIAGNOSIS — R3914 Feeling of incomplete bladder emptying: Secondary | ICD-10-CM | POA: Diagnosis not present

## 2014-03-15 DIAGNOSIS — J441 Chronic obstructive pulmonary disease with (acute) exacerbation: Secondary | ICD-10-CM | POA: Diagnosis not present

## 2014-03-20 DIAGNOSIS — J432 Centrilobular emphysema: Secondary | ICD-10-CM | POA: Diagnosis not present

## 2014-03-20 DIAGNOSIS — G4730 Sleep apnea, unspecified: Secondary | ICD-10-CM | POA: Diagnosis not present

## 2014-03-20 DIAGNOSIS — J439 Emphysema, unspecified: Secondary | ICD-10-CM | POA: Diagnosis not present

## 2014-03-20 DIAGNOSIS — R0602 Shortness of breath: Secondary | ICD-10-CM | POA: Diagnosis not present

## 2014-03-22 DIAGNOSIS — G4730 Sleep apnea, unspecified: Secondary | ICD-10-CM | POA: Diagnosis not present

## 2014-03-23 DIAGNOSIS — I472 Ventricular tachycardia: Secondary | ICD-10-CM | POA: Diagnosis not present

## 2014-03-23 DIAGNOSIS — I950 Idiopathic hypotension: Secondary | ICD-10-CM | POA: Diagnosis not present

## 2014-03-23 DIAGNOSIS — I25810 Atherosclerosis of coronary artery bypass graft(s) without angina pectoris: Secondary | ICD-10-CM | POA: Diagnosis not present

## 2014-03-23 DIAGNOSIS — E782 Mixed hyperlipidemia: Secondary | ICD-10-CM | POA: Diagnosis not present

## 2014-03-27 DIAGNOSIS — G4701 Insomnia due to medical condition: Secondary | ICD-10-CM | POA: Diagnosis not present

## 2014-03-27 DIAGNOSIS — I429 Cardiomyopathy, unspecified: Secondary | ICD-10-CM | POA: Diagnosis not present

## 2014-03-27 DIAGNOSIS — R0902 Hypoxemia: Secondary | ICD-10-CM | POA: Diagnosis not present

## 2014-03-27 DIAGNOSIS — R0682 Tachypnea, not elsewhere classified: Secondary | ICD-10-CM | POA: Diagnosis not present

## 2014-03-27 DIAGNOSIS — J439 Emphysema, unspecified: Secondary | ICD-10-CM | POA: Diagnosis not present

## 2014-04-06 DIAGNOSIS — I25810 Atherosclerosis of coronary artery bypass graft(s) without angina pectoris: Secondary | ICD-10-CM | POA: Diagnosis not present

## 2014-04-06 DIAGNOSIS — E782 Mixed hyperlipidemia: Secondary | ICD-10-CM | POA: Diagnosis not present

## 2014-04-06 DIAGNOSIS — I472 Ventricular tachycardia: Secondary | ICD-10-CM | POA: Diagnosis not present

## 2014-05-01 DIAGNOSIS — I429 Cardiomyopathy, unspecified: Secondary | ICD-10-CM | POA: Diagnosis not present

## 2014-05-01 DIAGNOSIS — J441 Chronic obstructive pulmonary disease with (acute) exacerbation: Secondary | ICD-10-CM | POA: Diagnosis not present

## 2014-05-01 DIAGNOSIS — R3914 Feeling of incomplete bladder emptying: Secondary | ICD-10-CM | POA: Diagnosis not present

## 2014-05-01 DIAGNOSIS — K5901 Slow transit constipation: Secondary | ICD-10-CM | POA: Diagnosis not present

## 2014-05-09 DIAGNOSIS — I472 Ventricular tachycardia: Secondary | ICD-10-CM | POA: Diagnosis not present

## 2014-05-09 DIAGNOSIS — I255 Ischemic cardiomyopathy: Secondary | ICD-10-CM | POA: Diagnosis not present

## 2014-05-09 DIAGNOSIS — I25810 Atherosclerosis of coronary artery bypass graft(s) without angina pectoris: Secondary | ICD-10-CM | POA: Diagnosis not present

## 2014-05-09 DIAGNOSIS — I5022 Chronic systolic (congestive) heart failure: Secondary | ICD-10-CM | POA: Diagnosis not present

## 2014-05-09 DIAGNOSIS — Z951 Presence of aortocoronary bypass graft: Secondary | ICD-10-CM | POA: Diagnosis not present

## 2014-05-09 DIAGNOSIS — I501 Left ventricular failure: Secondary | ICD-10-CM | POA: Diagnosis not present

## 2014-05-15 DIAGNOSIS — Z7982 Long term (current) use of aspirin: Secondary | ICD-10-CM | POA: Diagnosis not present

## 2014-05-15 DIAGNOSIS — I5022 Chronic systolic (congestive) heart failure: Secondary | ICD-10-CM | POA: Diagnosis not present

## 2014-05-15 DIAGNOSIS — I255 Ischemic cardiomyopathy: Secondary | ICD-10-CM | POA: Diagnosis not present

## 2014-05-15 DIAGNOSIS — Z79899 Other long term (current) drug therapy: Secondary | ICD-10-CM | POA: Diagnosis not present

## 2014-05-15 DIAGNOSIS — Z006 Encounter for examination for normal comparison and control in clinical research program: Secondary | ICD-10-CM | POA: Diagnosis not present

## 2014-05-15 DIAGNOSIS — I501 Left ventricular failure: Secondary | ICD-10-CM | POA: Diagnosis not present

## 2014-05-15 DIAGNOSIS — R918 Other nonspecific abnormal finding of lung field: Secondary | ICD-10-CM | POA: Diagnosis not present

## 2014-05-15 DIAGNOSIS — Z951 Presence of aortocoronary bypass graft: Secondary | ICD-10-CM | POA: Diagnosis not present

## 2014-05-15 DIAGNOSIS — I472 Ventricular tachycardia: Secondary | ICD-10-CM | POA: Diagnosis not present

## 2014-05-15 DIAGNOSIS — R55 Syncope and collapse: Secondary | ICD-10-CM | POA: Diagnosis not present

## 2014-05-16 DIAGNOSIS — R918 Other nonspecific abnormal finding of lung field: Secondary | ICD-10-CM | POA: Diagnosis not present

## 2014-05-16 DIAGNOSIS — I501 Left ventricular failure: Secondary | ICD-10-CM | POA: Diagnosis not present

## 2014-05-16 DIAGNOSIS — Z95810 Presence of automatic (implantable) cardiac defibrillator: Secondary | ICD-10-CM | POA: Diagnosis not present

## 2014-05-16 DIAGNOSIS — Z951 Presence of aortocoronary bypass graft: Secondary | ICD-10-CM | POA: Diagnosis not present

## 2014-05-16 DIAGNOSIS — Z7982 Long term (current) use of aspirin: Secondary | ICD-10-CM | POA: Diagnosis not present

## 2014-05-16 DIAGNOSIS — I5022 Chronic systolic (congestive) heart failure: Secondary | ICD-10-CM | POA: Diagnosis not present

## 2014-05-16 DIAGNOSIS — R55 Syncope and collapse: Secondary | ICD-10-CM | POA: Diagnosis not present

## 2014-05-16 DIAGNOSIS — I472 Ventricular tachycardia: Secondary | ICD-10-CM | POA: Diagnosis not present

## 2014-05-16 DIAGNOSIS — I255 Ischemic cardiomyopathy: Secondary | ICD-10-CM | POA: Diagnosis not present

## 2014-05-26 DIAGNOSIS — I2510 Atherosclerotic heart disease of native coronary artery without angina pectoris: Secondary | ICD-10-CM | POA: Diagnosis not present

## 2014-05-26 DIAGNOSIS — Z87891 Personal history of nicotine dependence: Secondary | ICD-10-CM | POA: Diagnosis not present

## 2014-05-26 DIAGNOSIS — E119 Type 2 diabetes mellitus without complications: Secondary | ICD-10-CM | POA: Diagnosis not present

## 2014-05-26 DIAGNOSIS — J449 Chronic obstructive pulmonary disease, unspecified: Secondary | ICD-10-CM | POA: Diagnosis not present

## 2014-05-26 DIAGNOSIS — Z48812 Encounter for surgical aftercare following surgery on the circulatory system: Secondary | ICD-10-CM | POA: Diagnosis not present

## 2014-05-26 DIAGNOSIS — Z95810 Presence of automatic (implantable) cardiac defibrillator: Secondary | ICD-10-CM | POA: Diagnosis not present

## 2014-05-26 DIAGNOSIS — I509 Heart failure, unspecified: Secondary | ICD-10-CM | POA: Diagnosis not present

## 2014-05-26 DIAGNOSIS — I10 Essential (primary) hypertension: Secondary | ICD-10-CM | POA: Diagnosis not present

## 2014-05-26 DIAGNOSIS — Z9981 Dependence on supplemental oxygen: Secondary | ICD-10-CM | POA: Diagnosis not present

## 2014-05-29 DIAGNOSIS — E119 Type 2 diabetes mellitus without complications: Secondary | ICD-10-CM | POA: Diagnosis not present

## 2014-05-29 DIAGNOSIS — Z48812 Encounter for surgical aftercare following surgery on the circulatory system: Secondary | ICD-10-CM | POA: Diagnosis not present

## 2014-05-29 DIAGNOSIS — I10 Essential (primary) hypertension: Secondary | ICD-10-CM | POA: Diagnosis not present

## 2014-05-29 DIAGNOSIS — I2510 Atherosclerotic heart disease of native coronary artery without angina pectoris: Secondary | ICD-10-CM | POA: Diagnosis not present

## 2014-05-29 DIAGNOSIS — I509 Heart failure, unspecified: Secondary | ICD-10-CM | POA: Diagnosis not present

## 2014-05-29 DIAGNOSIS — J449 Chronic obstructive pulmonary disease, unspecified: Secondary | ICD-10-CM | POA: Diagnosis not present

## 2014-05-31 DIAGNOSIS — I10 Essential (primary) hypertension: Secondary | ICD-10-CM | POA: Diagnosis not present

## 2014-05-31 DIAGNOSIS — E119 Type 2 diabetes mellitus without complications: Secondary | ICD-10-CM | POA: Diagnosis not present

## 2014-05-31 DIAGNOSIS — I2510 Atherosclerotic heart disease of native coronary artery without angina pectoris: Secondary | ICD-10-CM | POA: Diagnosis not present

## 2014-05-31 DIAGNOSIS — Z48812 Encounter for surgical aftercare following surgery on the circulatory system: Secondary | ICD-10-CM | POA: Diagnosis not present

## 2014-05-31 DIAGNOSIS — I509 Heart failure, unspecified: Secondary | ICD-10-CM | POA: Diagnosis not present

## 2014-05-31 DIAGNOSIS — J449 Chronic obstructive pulmonary disease, unspecified: Secondary | ICD-10-CM | POA: Diagnosis not present

## 2014-06-04 DIAGNOSIS — I10 Essential (primary) hypertension: Secondary | ICD-10-CM | POA: Diagnosis not present

## 2014-06-04 DIAGNOSIS — J449 Chronic obstructive pulmonary disease, unspecified: Secondary | ICD-10-CM | POA: Diagnosis not present

## 2014-06-04 DIAGNOSIS — Z48812 Encounter for surgical aftercare following surgery on the circulatory system: Secondary | ICD-10-CM | POA: Diagnosis not present

## 2014-06-04 DIAGNOSIS — I2510 Atherosclerotic heart disease of native coronary artery without angina pectoris: Secondary | ICD-10-CM | POA: Diagnosis not present

## 2014-06-04 DIAGNOSIS — E119 Type 2 diabetes mellitus without complications: Secondary | ICD-10-CM | POA: Diagnosis not present

## 2014-06-04 DIAGNOSIS — I509 Heart failure, unspecified: Secondary | ICD-10-CM | POA: Diagnosis not present

## 2014-06-05 DIAGNOSIS — I2510 Atherosclerotic heart disease of native coronary artery without angina pectoris: Secondary | ICD-10-CM | POA: Diagnosis not present

## 2014-06-05 DIAGNOSIS — I10 Essential (primary) hypertension: Secondary | ICD-10-CM | POA: Diagnosis not present

## 2014-06-05 DIAGNOSIS — E119 Type 2 diabetes mellitus without complications: Secondary | ICD-10-CM | POA: Diagnosis not present

## 2014-06-05 DIAGNOSIS — J449 Chronic obstructive pulmonary disease, unspecified: Secondary | ICD-10-CM | POA: Diagnosis not present

## 2014-06-05 DIAGNOSIS — Z95810 Presence of automatic (implantable) cardiac defibrillator: Secondary | ICD-10-CM | POA: Diagnosis not present

## 2014-06-05 DIAGNOSIS — I509 Heart failure, unspecified: Secondary | ICD-10-CM | POA: Diagnosis not present

## 2014-06-05 DIAGNOSIS — Z9981 Dependence on supplemental oxygen: Secondary | ICD-10-CM | POA: Diagnosis not present

## 2014-06-06 DIAGNOSIS — I472 Ventricular tachycardia: Secondary | ICD-10-CM | POA: Diagnosis not present

## 2014-06-06 DIAGNOSIS — E782 Mixed hyperlipidemia: Secondary | ICD-10-CM | POA: Diagnosis not present

## 2014-06-06 DIAGNOSIS — I25810 Atherosclerosis of coronary artery bypass graft(s) without angina pectoris: Secondary | ICD-10-CM | POA: Diagnosis not present

## 2014-06-06 DIAGNOSIS — I5022 Chronic systolic (congestive) heart failure: Secondary | ICD-10-CM | POA: Diagnosis not present

## 2014-06-07 DIAGNOSIS — Z48812 Encounter for surgical aftercare following surgery on the circulatory system: Secondary | ICD-10-CM | POA: Diagnosis not present

## 2014-06-07 DIAGNOSIS — E119 Type 2 diabetes mellitus without complications: Secondary | ICD-10-CM | POA: Diagnosis not present

## 2014-06-07 DIAGNOSIS — I509 Heart failure, unspecified: Secondary | ICD-10-CM | POA: Diagnosis not present

## 2014-06-07 DIAGNOSIS — I2510 Atherosclerotic heart disease of native coronary artery without angina pectoris: Secondary | ICD-10-CM | POA: Diagnosis not present

## 2014-06-07 DIAGNOSIS — I10 Essential (primary) hypertension: Secondary | ICD-10-CM | POA: Diagnosis not present

## 2014-06-07 DIAGNOSIS — J449 Chronic obstructive pulmonary disease, unspecified: Secondary | ICD-10-CM | POA: Diagnosis not present

## 2014-06-12 DIAGNOSIS — J449 Chronic obstructive pulmonary disease, unspecified: Secondary | ICD-10-CM | POA: Diagnosis not present

## 2014-06-12 DIAGNOSIS — E119 Type 2 diabetes mellitus without complications: Secondary | ICD-10-CM | POA: Diagnosis not present

## 2014-06-12 DIAGNOSIS — I10 Essential (primary) hypertension: Secondary | ICD-10-CM | POA: Diagnosis not present

## 2014-06-12 DIAGNOSIS — I509 Heart failure, unspecified: Secondary | ICD-10-CM | POA: Diagnosis not present

## 2014-06-12 DIAGNOSIS — I2510 Atherosclerotic heart disease of native coronary artery without angina pectoris: Secondary | ICD-10-CM | POA: Diagnosis not present

## 2014-06-12 DIAGNOSIS — Z48812 Encounter for surgical aftercare following surgery on the circulatory system: Secondary | ICD-10-CM | POA: Diagnosis not present

## 2014-06-19 DIAGNOSIS — E119 Type 2 diabetes mellitus without complications: Secondary | ICD-10-CM | POA: Diagnosis not present

## 2014-06-19 DIAGNOSIS — I2510 Atherosclerotic heart disease of native coronary artery without angina pectoris: Secondary | ICD-10-CM | POA: Diagnosis not present

## 2014-06-19 DIAGNOSIS — I10 Essential (primary) hypertension: Secondary | ICD-10-CM | POA: Diagnosis not present

## 2014-06-19 DIAGNOSIS — Z48812 Encounter for surgical aftercare following surgery on the circulatory system: Secondary | ICD-10-CM | POA: Diagnosis not present

## 2014-06-19 DIAGNOSIS — I509 Heart failure, unspecified: Secondary | ICD-10-CM | POA: Diagnosis not present

## 2014-06-19 DIAGNOSIS — J449 Chronic obstructive pulmonary disease, unspecified: Secondary | ICD-10-CM | POA: Diagnosis not present

## 2014-06-21 DIAGNOSIS — M545 Low back pain: Secondary | ICD-10-CM | POA: Diagnosis not present

## 2014-06-21 DIAGNOSIS — R0902 Hypoxemia: Secondary | ICD-10-CM | POA: Diagnosis not present

## 2014-06-21 DIAGNOSIS — E119 Type 2 diabetes mellitus without complications: Secondary | ICD-10-CM | POA: Diagnosis not present

## 2014-06-21 DIAGNOSIS — R0602 Shortness of breath: Secondary | ICD-10-CM | POA: Diagnosis not present

## 2014-06-21 DIAGNOSIS — J449 Chronic obstructive pulmonary disease, unspecified: Secondary | ICD-10-CM | POA: Diagnosis not present

## 2014-06-22 ENCOUNTER — Ambulatory Visit: Payer: Self-pay | Admitting: Internal Medicine

## 2014-06-22 DIAGNOSIS — M47817 Spondylosis without myelopathy or radiculopathy, lumbosacral region: Secondary | ICD-10-CM | POA: Diagnosis not present

## 2014-06-22 DIAGNOSIS — M5136 Other intervertebral disc degeneration, lumbar region: Secondary | ICD-10-CM | POA: Diagnosis not present

## 2014-06-22 DIAGNOSIS — M5134 Other intervertebral disc degeneration, thoracic region: Secondary | ICD-10-CM | POA: Diagnosis not present

## 2014-06-26 DIAGNOSIS — I509 Heart failure, unspecified: Secondary | ICD-10-CM | POA: Diagnosis not present

## 2014-06-26 DIAGNOSIS — E119 Type 2 diabetes mellitus without complications: Secondary | ICD-10-CM | POA: Diagnosis not present

## 2014-06-26 DIAGNOSIS — Z48812 Encounter for surgical aftercare following surgery on the circulatory system: Secondary | ICD-10-CM | POA: Diagnosis not present

## 2014-06-26 DIAGNOSIS — I2510 Atherosclerotic heart disease of native coronary artery without angina pectoris: Secondary | ICD-10-CM | POA: Diagnosis not present

## 2014-06-26 DIAGNOSIS — J449 Chronic obstructive pulmonary disease, unspecified: Secondary | ICD-10-CM | POA: Diagnosis not present

## 2014-06-26 DIAGNOSIS — I10 Essential (primary) hypertension: Secondary | ICD-10-CM | POA: Diagnosis not present

## 2014-06-28 DIAGNOSIS — I5022 Chronic systolic (congestive) heart failure: Secondary | ICD-10-CM | POA: Diagnosis not present

## 2014-06-28 DIAGNOSIS — I10 Essential (primary) hypertension: Secondary | ICD-10-CM | POA: Diagnosis not present

## 2014-06-28 DIAGNOSIS — I25810 Atherosclerosis of coronary artery bypass graft(s) without angina pectoris: Secondary | ICD-10-CM | POA: Diagnosis not present

## 2014-06-28 DIAGNOSIS — I472 Ventricular tachycardia: Secondary | ICD-10-CM | POA: Diagnosis not present

## 2014-07-03 DIAGNOSIS — M2578 Osteophyte, vertebrae: Secondary | ICD-10-CM | POA: Diagnosis not present

## 2014-07-12 IMAGING — CR DG SHOULDER 3+V*R*
1 series · 4 of 4 positions shown · non-contrast
Comparison: Chest x-ray 08/09/2012

CLINICAL DATA: Fall, right shoulder pain.

EXAM:
DG SHOULDER 3+ VIEWS RIGHT

[Series 1: w shoulder grashey right · 0.14mm/px · 4 of 4 slices shown]
[im 1/4]
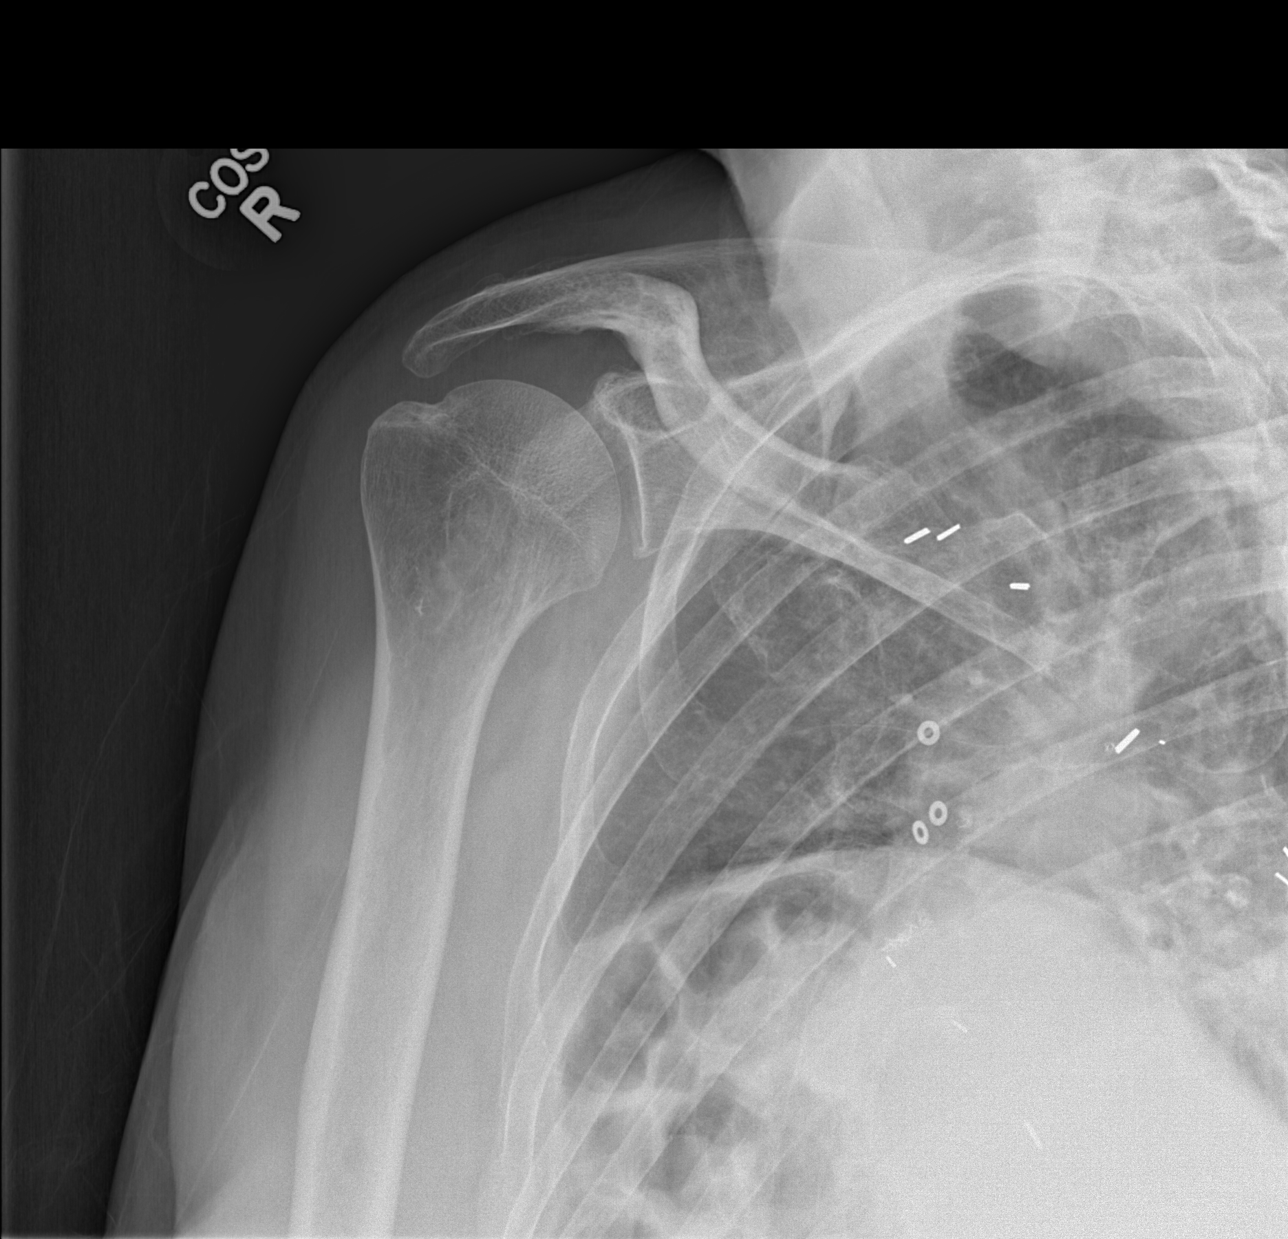
[im 2/4]
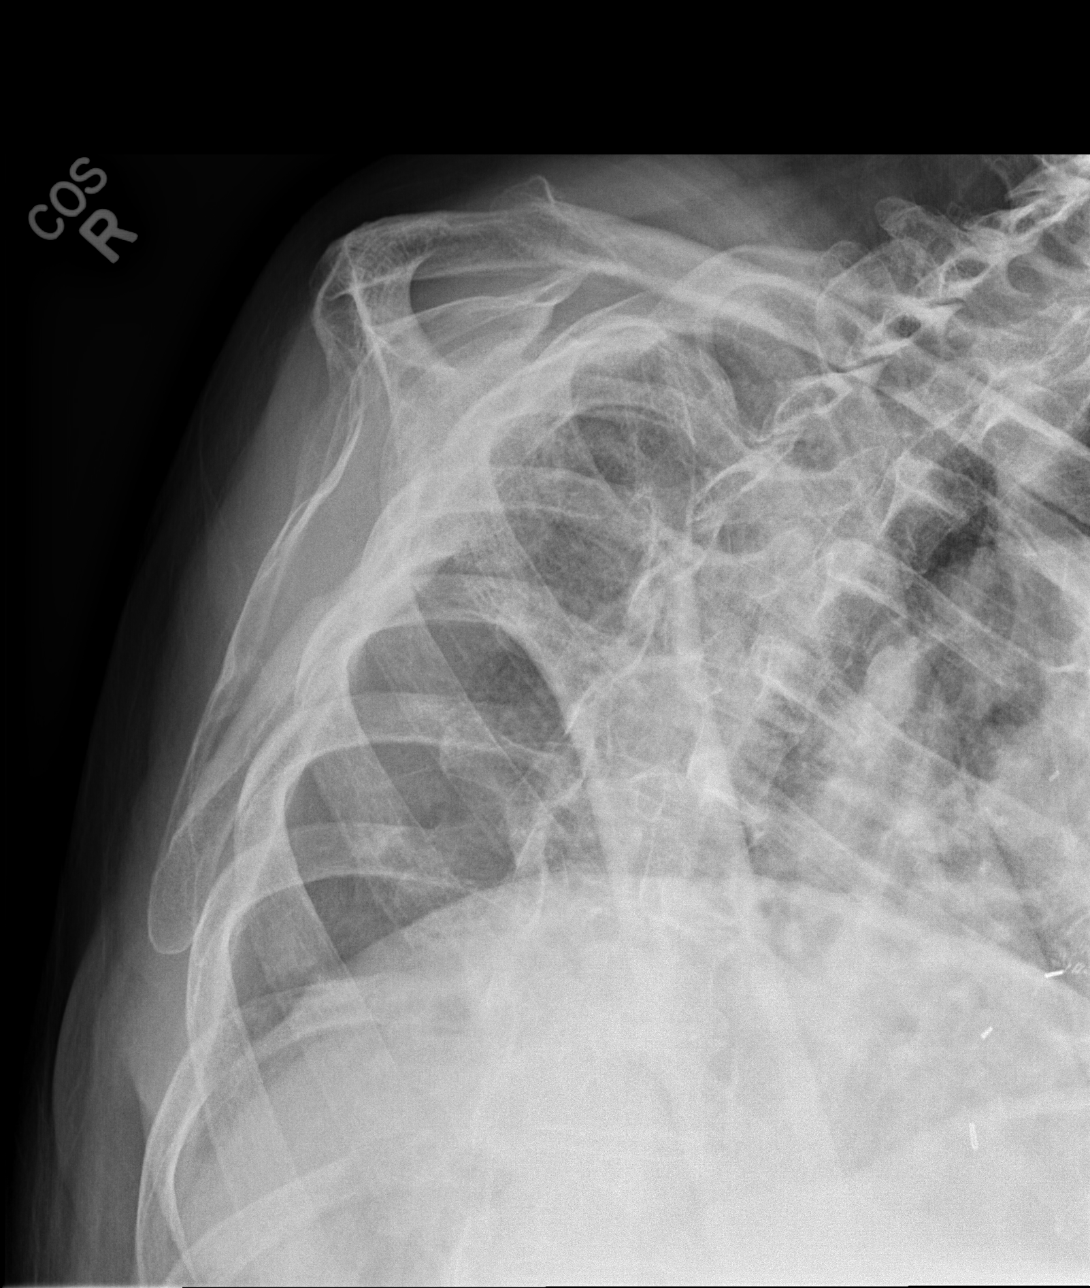
[im 3/4]
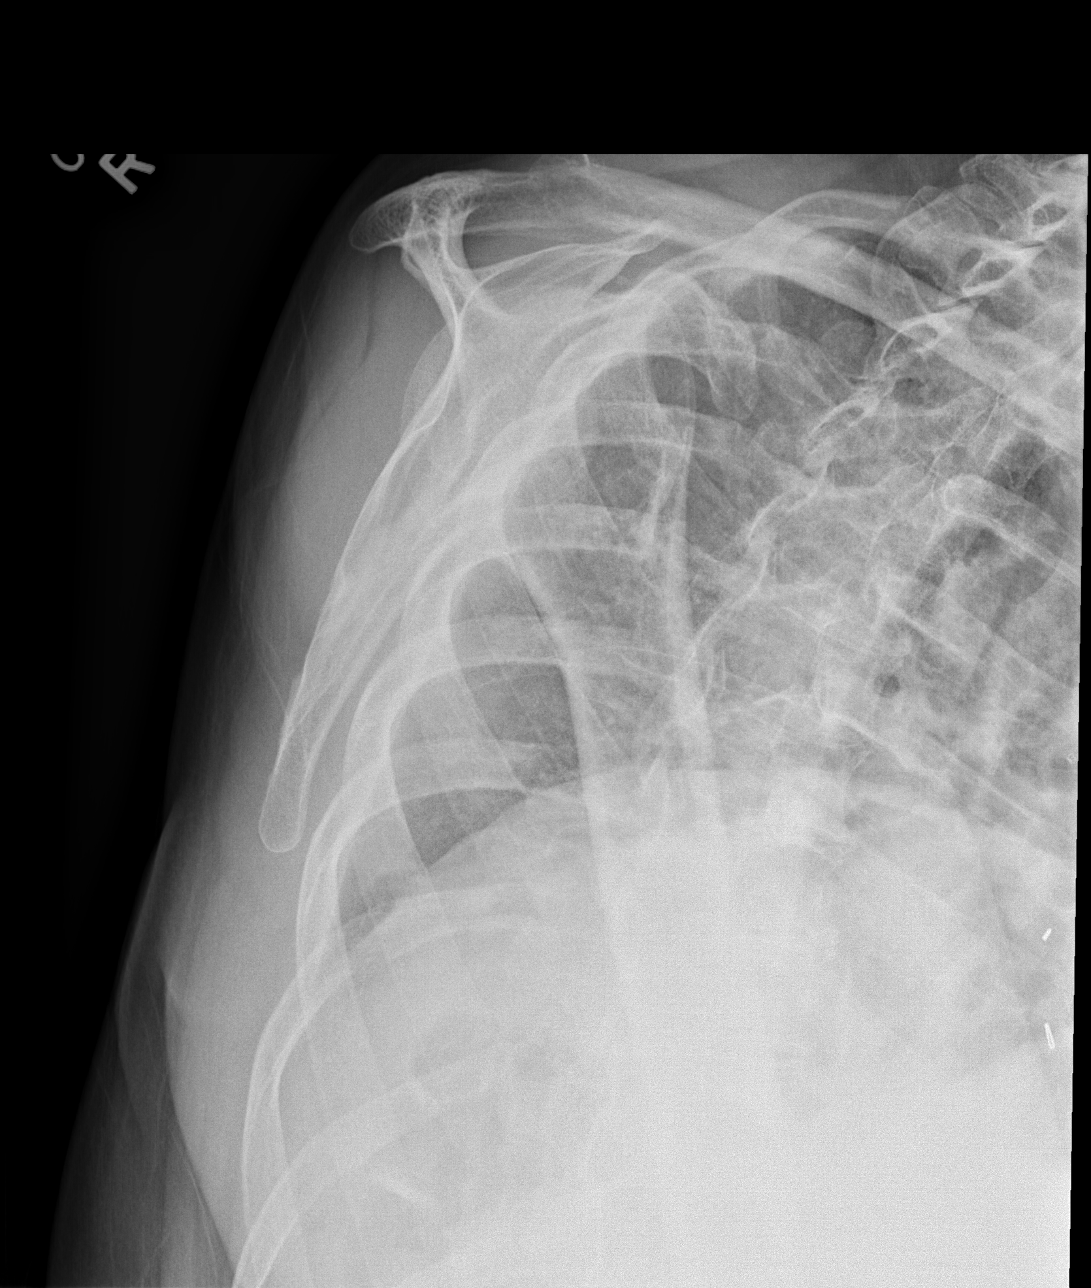
[im 4/4]
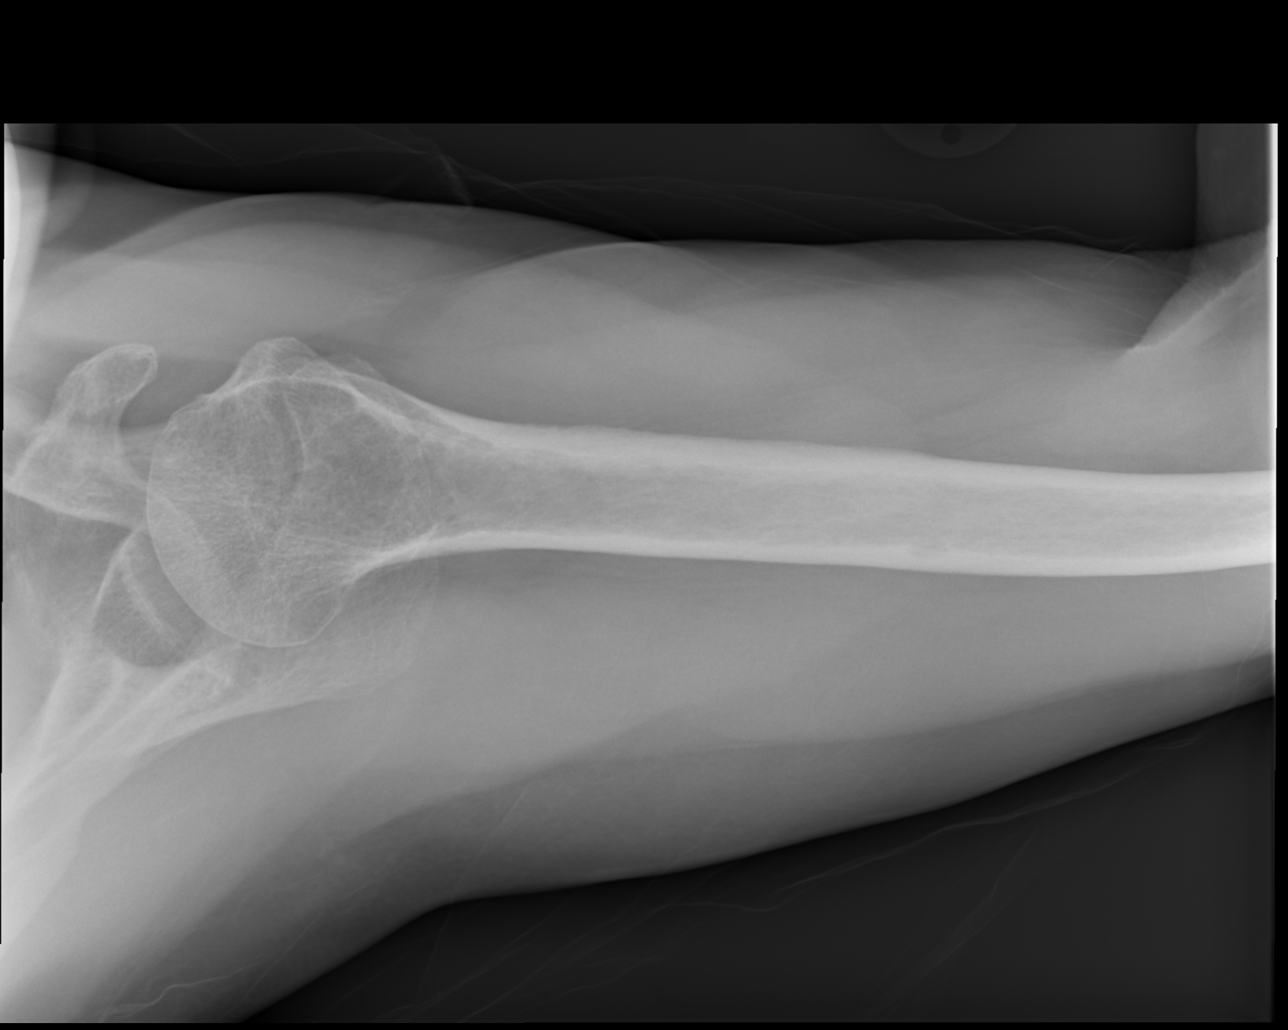

[4 of 4 positions shown; findings below may reference images not displayed]

FINDINGS: There is no evidence of fracture or dislocation. There is no
evidence of arthropathy or other focal bone abnormality. Soft
tissues are unremarkable.
IMPRESSION: Negative.

## 2014-07-12 IMAGING — CR LEFT WRIST - COMPLETE 3+ VIEW
1 series · 4 of 4 positions shown · non-contrast
Comparison: None.

CLINICAL DATA: Fall, pain.

EXAM:
LEFT WRIST - COMPLETE 3+ VIEW

[Series 1: x wrist pa left · 0.14mm/px · 4 of 4 slices shown]
[im 1/4]
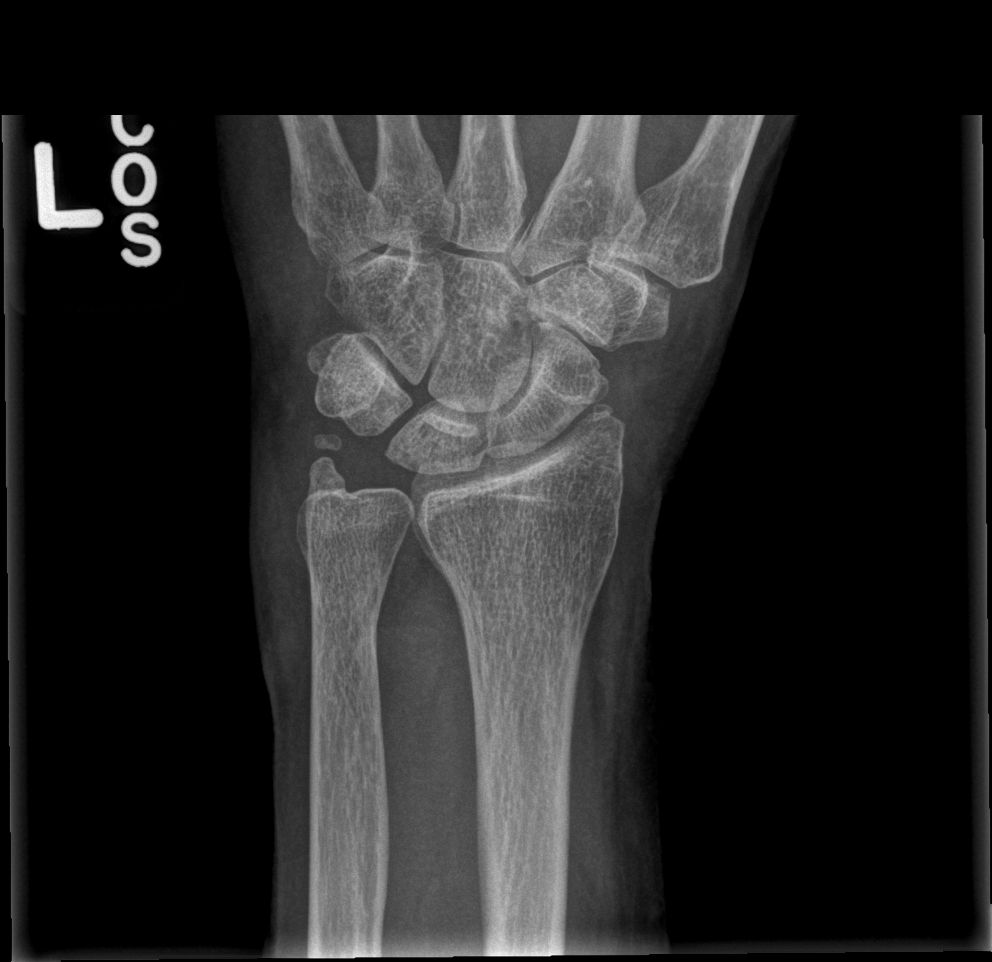
[im 2/4]
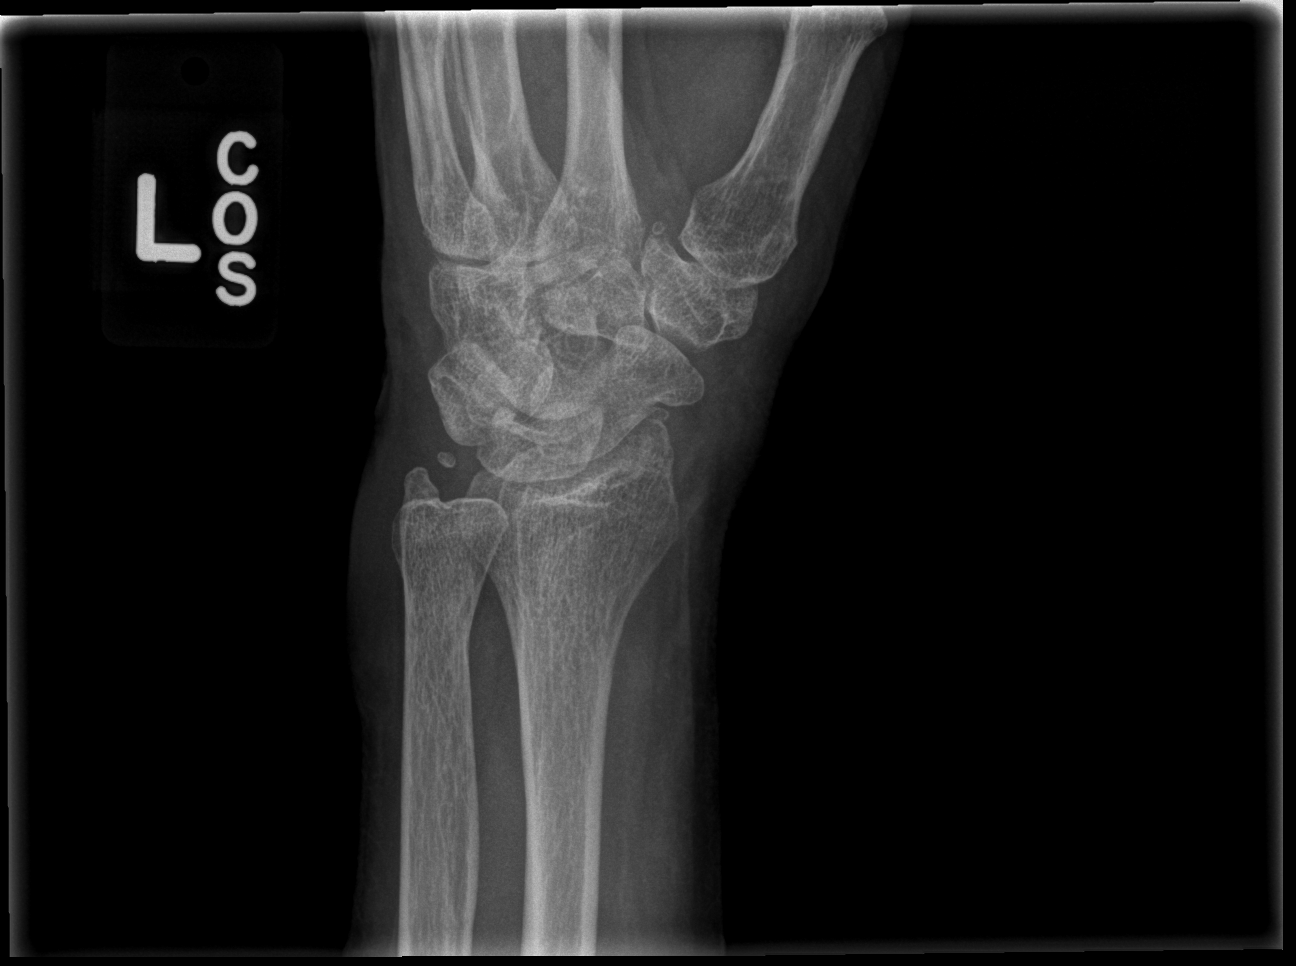
[im 3/4]
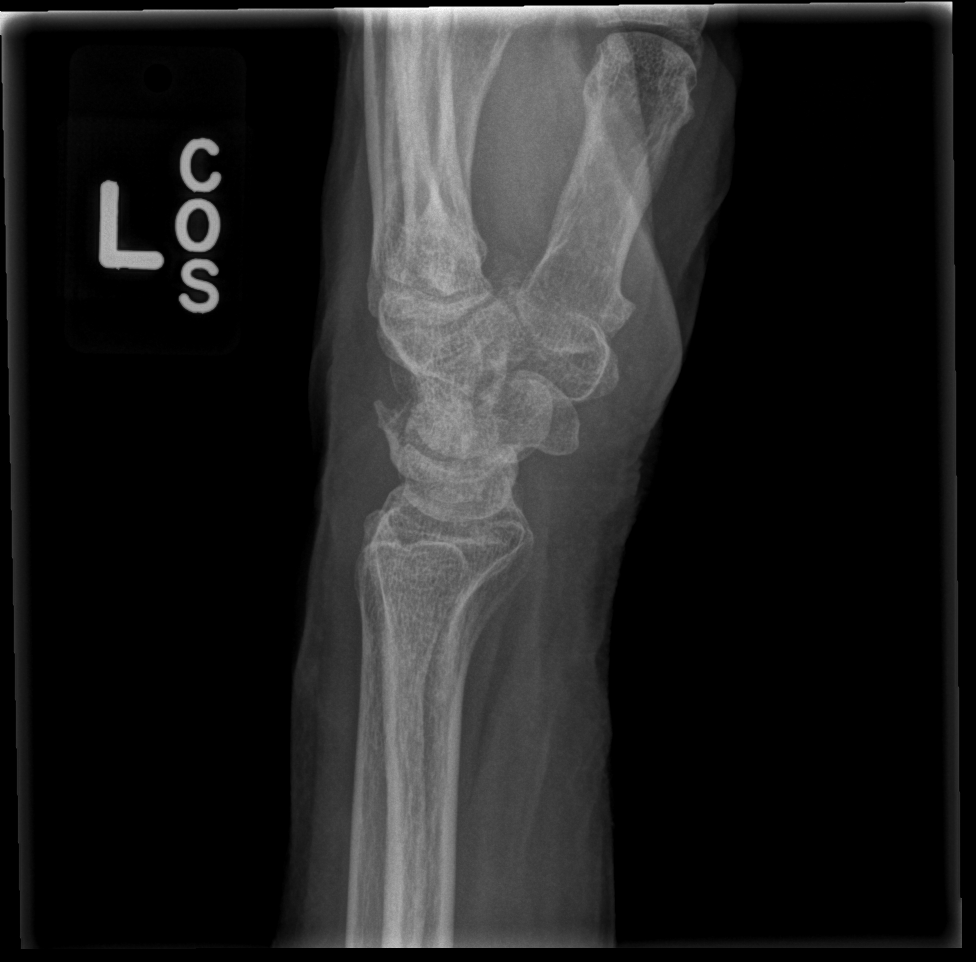
[im 4/4]
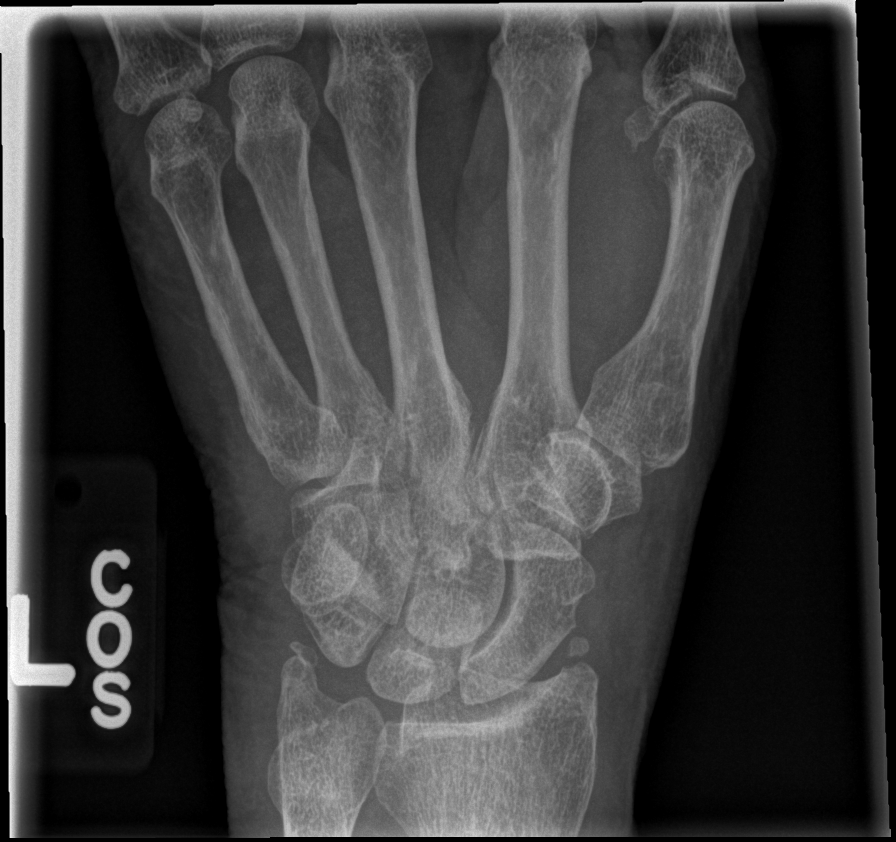

[4 of 4 positions shown; findings below may reference images not displayed]

FINDINGS: No acute bony abnormality. Specifically, no acute fracture,
subluxation, or dislocation. Soft tissues are intact. Osteoarthritic
changes at the first carpometacarpal joint.
IMPRESSION: No acute bony abnormality.

## 2014-07-17 DIAGNOSIS — B351 Tinea unguium: Secondary | ICD-10-CM | POA: Diagnosis not present

## 2014-07-17 DIAGNOSIS — L851 Acquired keratosis [keratoderma] palmaris et plantaris: Secondary | ICD-10-CM | POA: Diagnosis not present

## 2014-07-17 DIAGNOSIS — E1142 Type 2 diabetes mellitus with diabetic polyneuropathy: Secondary | ICD-10-CM | POA: Diagnosis not present

## 2014-08-14 DIAGNOSIS — Z23 Encounter for immunization: Secondary | ICD-10-CM | POA: Diagnosis not present

## 2014-08-29 DIAGNOSIS — I5022 Chronic systolic (congestive) heart failure: Secondary | ICD-10-CM | POA: Diagnosis not present

## 2014-08-29 DIAGNOSIS — I2510 Atherosclerotic heart disease of native coronary artery without angina pectoris: Secondary | ICD-10-CM | POA: Diagnosis not present

## 2014-08-29 DIAGNOSIS — I501 Left ventricular failure: Secondary | ICD-10-CM | POA: Diagnosis not present

## 2014-08-29 DIAGNOSIS — I10 Essential (primary) hypertension: Secondary | ICD-10-CM | POA: Diagnosis not present

## 2014-08-29 DIAGNOSIS — I472 Ventricular tachycardia: Secondary | ICD-10-CM | POA: Diagnosis not present

## 2014-08-29 DIAGNOSIS — Z95810 Presence of automatic (implantable) cardiac defibrillator: Secondary | ICD-10-CM | POA: Diagnosis not present

## 2014-08-29 DIAGNOSIS — Z951 Presence of aortocoronary bypass graft: Secondary | ICD-10-CM | POA: Diagnosis not present

## 2014-09-15 NOTE — H&P (Signed)
PATIENT NAME:  Carlos Beck, Carlos Beck MR#:  [id number] DATE OF BIRTH:  Jul 26, 1925  DATE OF ADMISSION:  02/08/2014  PRIMARY CARE PROVIDER: Lyndon Code, MD.   EMERGENCY DEPARTMENT REFERRING PHYSICIAN:  Eartha Inch. York Cerise, MD.  CHIEF COMPLAINT: Cough, shortness of breath.   HISTORY OF PRESENT ILLNESS: The patient is a pleasant 81 year old [place] male with history of ischemic cardiomyopathy, EF of approximately 20%, status post CABG, history of type 2 diabetes, hypertension, hyperlipidemia, history of asthma, who uses inhalers on a daily basis, as well as nebulizers 2 times a day. He has not been feeling well for the past few days. He has had a cough and congestion ongoing for the past 2 days associated with wheezing. His cough is very dry and he is not able to break that up. He also started feeling short of breath with these symptoms. Last night he had frequency of urination, every hour, and he did not feel well, therefore, he decided to come to the Emergency Room. He has chronic left leg swelling that is unchanged. He has not had any chest pains or palpitations. No fevers or chills. No nausea, vomiting, diarrhea. During my evaluation in the ED, while I was talking with him, the patient was noted to have 18 beats of ventricular tachycardia. He also has had intermittent irregular rhythm while in the ED.   PAST MEDICAL HISTORY: Significant for:  1.  Chronic systolic CHF due to ischemic cardiomyopathy, EF of 20%, status post CABG.  2.  Diabetes type 2.  3.  Hypertension.  4.  Hyperlipidemia.  5.  History of asthma.   CURRENT MEDICATIONS AT HOME:  He is on aspirin 81 mg 1 tablet p.o. daily, Lipitor 20 daily, dutasteride 1 tablet p.o. daily, theophylline ER 100 mg 1 tablet p.o. daily, Jalyn 0.5/0.4 one tablet p.o. daily, omeprazole 40 daily, triamcinolone 0.5% apply to affected area b.i.d., Flonase 0.05% nasal spray 1 spray to each nostril daily, Zyrtec 10 daily, vitamin D 2000 International Units daily, Spiriva  18 mcg daily, Symbicort 1 puff b.i.d., Xopenex nebulizers t.i.d., MiraLax 17 grams daily, Amaryl 1 mg daily, Lamisil 250 mg 1 tablet p.o. daily, Lasix 20 mg daily, Ventolin 1 to 2 puffs q. 4-6 hours p.r.n.   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 No alcohol or recreational drugs.   ALLERGIES: None.   FAMILY HISTORY: Father died of natural causes in his 67s. Mother had diabetes and asthma and died in her 90s due to asthma complications.   REVIEW OF SYSTEMS: CONSTITUTIONAL: Denies any fevers. Complains of some fatigue, weakness. No weight loss. No weight gain.  EYES: No blurred or double vision. No redness. No inflammation.  EARS, NOSE, THROAT: No tinnitus. No ear pain, mild hearing loss. Does have year-round allergies. No epistaxis. No nasal discharge. No difficulty swallowing.  RESPIRATORY: Complains of cough, wheezing. No hemoptysis. Has asthma.  CARDIOVASCULAR: Denies any chest pain, orthopnea; has chronic left lower extremity edema as a result of bypass surgery. No arrhythmia. No palpitations.  GASTROINTESTINAL: No nausea, vomiting, diarrhea. No abdominal pain. No hematemesis. No melena.  GENITOURINARY: Denies any dysuria, hematuria. Complains or frequency.  ENDOCRINE: Denies any polyuria, nocturia or thyroid problems.  HEMATOLOGIC AND LYMPHATIC: Denies anemia, easy bruisability or bleeding.  SKIN: No acne. No rash.  MUSCULOSKELETAL: No pain in the neck, back or shoulder.  NEUROLOGIC: No numbness, CVA, transient ischemic attack or seizures.  PSYCHIATRIC: No anxiety, insomnia.   PHYSICAL EXAMINATION:  VITAL SIGNS:  Temperature 97.9, pulse 96, respirations 22, blood pressure 150/81, oxygen saturation 98%.  GENERAL: The patient is elderly male in no acute distress.  HEENT: Head atraumatic, normocephalic. Pupils equally round, reactive to light and accommodation. There is no conjunctival pallor. No sclerae icterus. Nasal exam shows no drainage or  ulceration. Oropharynx is clear without any exudate. External ear exam shows no erythema or drainage.  NECK: Supple without any JVD.  CARDIOVASCULAR: Regular rate and rhythm. No murmurs, rubs, clicks, or gallops.  LUNGS: He has some wheezing throughout both lungs as well as crackles at the bases. No accessory muscle usage.  ABDOMEN: Soft, nontender, nondistended. Positive bowel sounds x4.  EXTREMITIES: Left lower extremity swelling, which is chronic. No swelling on the right lower extremity.  SKIN: No rash.  LYMPHATIC:  Lymph nodes not palpable.  VASCULAR: Good DP, PT pulses.  PSYCHIATRIC: Not anxious or depressed.  NEUROLOGIC: Awake, alert, oriented x3. No focal deficits.   LABORATORY DATA: Glucose 68, BUN 11, creatinine 1.04, sodium 135, potassium 4.2, chloride 103, CO2 28, calcium 9.0, troponin 0.07, WBC 5.9, hemoglobin 11.2, platelet count 177.   Urinalysis:  Nitrites negative, leukocytes negative.   Chest x-ray shows bilateral atelectasis versus scarring. No evidence of CHF.   ASSESSMENT AND PLAN: The patient is a 81 year old [place] male with history of asthma, congestive heart failure, coronary artery disease, hypertension, diabetes, who presented with cough, shortness of breath, wheezing, noted to have possible asthma exacerbation. The patient also noted to have ventricular tachycardia while in the ED.  1.  Ventricular tachycardia. Will check a stat magnesium, start him on amiodarone p.o. Cardiology evaluation. Check a TSH. Echocardiogram of the heart.  2.  Shortness of breath, cough due to asthma exacerbation, possible acute systolic CHF. At this time we will treat him with nebulizers, IV steroids, antibiotics, Mucinex and monitor his respiratory status.  3.  Elevated troponin, possible demand ischemia. Cardiology evaluation. Will get an echocardiogram of the heart. Continue aspirin and start him on low-dose Coreg.  4.  Diabetes type 2. Start him on sliding scale insulin, Amaryl p.o.    5.  Hyperlipidemia. Continue Lipitor. Fasting lipid panel in the a.m.  6.  Hypertension. The patient will be on a low-dose Coreg.  7.  DVT prophylaxis with Lovenox.    Time spent with this patient: 55 minutes.      ____________________________ Lacie Scotts Allena Katz, MD shp:lt D: 02/08/2014 10:04:08 ET T: 02/08/2014 10:41:21 ET JOB#: [id number]  cc: Bond Grieshop H. Allena Katz, MD, <Dictator> Charise Carwin MD ELECTRONICALLY SIGNED 02/09/2014 8:44

## 2014-09-15 NOTE — Consult Note (Signed)
PATIENT NAME:  Carlos Beck, Carlos Beck MR#:  [id number] DATE OF BIRTH:  10-10-1925  DATE OF CONSULTATION:  02/08/2014   CONSULTING PHYSICIAN:  Laurier Nancy, MD  HISTORY OF PRESENT ILLNESS: This is an 81 year old [place] male with a past medical history of CABG, severe LV dysfunction, who presented to the hospital with some urinary symptoms.  He apparently was also short of breath with acute respiratory failure, probably asthma-related.  Initially, that is what it was thought, but he has been complaining of cough and shortness of breath for a couple of weeks.  He denies any chest pain and actually right now he has no significant shortness of breath.  No PND, orthopnea.  He was noted to have 18 beats of nonsustained ventricular tachycardia on the monitor while in the Emergency Room thus we were being consulted to further evaluate the patient.  The patient did not have any syncopal episodes or dizziness associated with this.    PAST MEDICAL HISTORY:  Congestive heart failure, history of cardiomyopathy with ejection fraction of 20%, history of CABG, history of diabetes mellitus x 2, hypertension, hyperlipidemia, history of asthma.   HOME MEDICATIONS:  Aspirin, Lipitor 20, Ventolin inhalers, Spiriva inhalers, and vitamins.   SOCIAL HISTORY: He denies EtOH abuse, quit smoking which he used to snuff a couple of years back.   ALLERGIES: None.   FAMILY HISTORY: Father died of natural causes. Mother had diabetes and asthma.   PHYSICAL EXAMINATION:  GENERAL:  He is alert and oriented x 3, blood pressure 150/81, respirations 22, pulse 96, temperature 97.9, saturation was 98%.  HEENT: No JVD.  LUNGS: There was diffuse rhonchi bilaterally with occasional wheezing.  HEART: He was tachy. Normal S1, S2. No audible murmur.  ABDOMEN: Soft, nontender, positive bowel sounds.  EXTREMITIES: There was no pedal edema.  NEUROLOGIC: The patient appears to be intact.   LABORATORY AND DIAGNOSTICS:  His EKG showed sinus  rhythm, 96 beats per minute, poor R-wave progression suggestive of old anteroseptal wall myocardial infarction with nonspecific ST-T changes.  CPK was 312. Troponin was 0.05, magnesium was 2.1.  Initial troponin, however, was 0.07.  His BUN and creatinine is 11 and 1.04.  Potassium is 4.2.  His white count was 5.9.  Hemoglobin 11.2, hematocrit 34, platelet count 177.  His echocardiogram was done which showed mildly dilated left ventricle severely dilated left atrium, mild to moderate mitral regurgitation, mild tricuspid regurgitation. There is diffuse hypokinesis with severe LV dysfunction.   ASSESSMENT AND PLAN: The patient has ischemic cardiomyopathy with a prior history of coronary artery bypass grafting with echocardiogram findings today showing severe LV dysfunction, LVEF of 20% with diffuse hypokinesis, mildly dilated left ventricle with moderately dilated left atrium, mild to moderate mitral regurgitation, mild tricuspid regurgitation.  The patient currently is just getting p.o. Lasix and aspirin and Lipitor 20 mg, advise adding Aldactone 25 mg. Also change Lasix to 20 mg IV every 12.  Also will add enalapril 5 mg once a day.  Continue Coreg at 6.25 twice a day and will increase that further as patient tolerates.  The patient also had nonsustained ventricular tachycardia, 18 beats without any syncopal episodes.  This is most likely due to severe left ventricular dysfunction with ejection fraction 20%.   Electrolytes appeared to be normal.   \Agree with starting the patient on amiodarone 400 b.i.d., will eventually decrease it to 400 once a day.  Also, we will have Marchelle Folks, our head nurse in our office at [facility], arrange for  life vest for the patient. The patient also had a mildly elevated troponin which on follow-up blood test is negative, probably related to demand ischemia and underlying congestive heart failure and coronary artery disease.  The patient probably will need outpatient [facility], but not at this time.  At this time, the patient has decompensated congestive heart failure. We will get the patient better before proceeding with doing a nuclear stress test.   Thank you very much for the referral.     ____________________________ Laurier Nancy, MD sak:DT D: 02/08/2014 14:37:20 ET T: 02/08/2014 15:06:12 ET JOB#: [id number]  cc: Laurier Nancy, MD, <Dictator> Laurier Nancy MD ELECTRONICALLY SIGNED 03/01/2014 9:55

## 2014-09-15 NOTE — Discharge Summary (Signed)
PATIENT NAME:  Carlos Beck, Carlos Beck MR#:  [id number] DATE OF BIRTH:  [birth year]  DATE OF ADMISSION:  02/08/2014 DATE OF DISCHARGE:    ADMITTING DIAGNOSES:  1.  Cough and shortness of breath.  2.  Increasing urinary frequency.   DISCHARGE DIAGNOSES: 1.  Cough and shortness of breath due to acute systolic congestive heart failure, as well as asthma exacerbation along with acute bronchitis. The patient's symptoms have now improved.  2.  Ventricular tachycardia with 18 beats noted on telemetry. Cardiology consult done. The patient started on amiodarone and Coreg. Will have a LifeVest in place per Dr. Welton Flakes.  3.  Acute systolic congestive heart failure with significant systolic dysfunction. The patient's medications adjusted.  4.  Elevated troponin, possibly demand ischemia.  5.  Diabetes type 2.  6.  Hyperlipidemia.  7.  Hypertension.   CONSULTANTS: Dr. Welton Flakes.   PERTINENT LABORATORIES AND EVALUATIONS: Admitting glucose 68, BUN 11, creatinine 1.04. Sodium 135, potassium 4.2, chloride 103, CO2 of 28, calcium 2.1. LFTs, LDL 38, triglycerides 48, HDL 79. Troponin was 0.07, then 0.05, and then 0.04. CK-MB was 4.5, 4.4 and 5.0. TSH 1.43. WBC 5.9, hemoglobin 11.2, platelet count was 177,000. PSA level was 0.1. Urinalysis was negative. Echocardiogram showed EF 20% to 25%, impaired relaxation of the left ventricular diastolic filling, moderately enlarged right ventricle, severely dilated left atrium, mild to moderate mitral valve regurgitation. Chest x-ray showed no acute cardiopulmonary processes except bibasilar atelectasis.   HOSPITAL COURSE: Please refer to H and P done by me on admission yesterday. The patient is an 81 year old [place] male with history of coronary artery disease with ischemic cardiomyopathy. He has a history of asthma, presented with cough, shortness of breath, increase in urinary frequency. The patient was seen in the ED and was admitted for elevated troponin and his asthma exacerbation.  While being admitted, he was noted to have 18 runs of V. tach. The patient due to this fact was started on amiodarone and Coreg. He was also diuresed because there was physical exam consistent with possible fluid overload with crackles in the lungs. He was given IV Lasix with a good urine output. The patient's breathing is much improved. His cough is improved. He will be discharged on a prednisone taper, as well as p.o. antibiotics. The patient was also seen by cardiology for his ventricular tachycardia. He recommended a LifeVest and amiodarone and Coreg therapy. The patient given discharge for CHF.    DISCHARGE MEDICATIONS: Aspirin 81 mg 1 tab p.o. daily, Lipitor 20 daily, Jalyn 0.5 to 0.4 one tab p.o. daily, omeprazole 40 daily, Flonase 1 spray, Zyrtec 10 daily, vitamin D3 at 2000 international units daily, Spiriva 18 mcg daily, Symbicort 2 puffs b.i.d., Xopenex 3 times a day as needed, MiraLax 17 grams daily, Amaryl 1 mg 1 tab p.o. b.i.d., Lasix 20 mg 1 tab p.o. daily, Ventolin 2 puffs 3 times a day, prednisone taper starting at 60 mg taper by 10 until complete, spironolactone 25 daily, enalapril 2.5 daily, amiodarone 400 daily, carvedilol 6.251 tabs p.o. b.i.d., guaifenesin 600 one tab p.o. b.i.d., as well as guaifenesin 15 mL q. 6 p.r.n. for cough, Levaquin 250 one tab p.o. q. 24 x 4 days.   DIET: Low sodium, low fat, low cholesterol, carbohydrate -controlled diet.   TIMEFRAME FOR FOLLOWUP: Dr. Welton Flakes in 1 to 2 weeks. Follow with primary MD in 1 to 2 weeks. Follow up with primary urologist in 1 to 2 weeks.   Please note, I discussed the case with his  daughter-in-law.   TIME SPENT ON THIS: 40 minutes.    ____________________________ Lacie Scotts Allena Katz, MD shp:at D: 02/09/2014 13:13:24 ET T: 02/09/2014 13:38:16 ET JOB#: [id number]  cc: Fareeha Evon H. Allena Katz, MD, <Dictator> Charise Carwin MD ELECTRONICALLY SIGNED 02/17/2014 7:57

## 2014-10-02 DIAGNOSIS — I5020 Unspecified systolic (congestive) heart failure: Secondary | ICD-10-CM | POA: Diagnosis not present

## 2014-10-11 DIAGNOSIS — I429 Cardiomyopathy, unspecified: Secondary | ICD-10-CM | POA: Diagnosis not present

## 2014-10-11 DIAGNOSIS — R634 Abnormal weight loss: Secondary | ICD-10-CM | POA: Diagnosis not present

## 2014-10-11 DIAGNOSIS — E119 Type 2 diabetes mellitus without complications: Secondary | ICD-10-CM | POA: Diagnosis not present

## 2014-10-11 DIAGNOSIS — E559 Vitamin D deficiency, unspecified: Secondary | ICD-10-CM | POA: Diagnosis not present

## 2014-10-11 DIAGNOSIS — I10 Essential (primary) hypertension: Secondary | ICD-10-CM | POA: Diagnosis not present

## 2014-11-12 DIAGNOSIS — R0902 Hypoxemia: Secondary | ICD-10-CM | POA: Diagnosis not present

## 2014-11-12 DIAGNOSIS — J449 Chronic obstructive pulmonary disease, unspecified: Secondary | ICD-10-CM | POA: Diagnosis not present

## 2014-12-06 DIAGNOSIS — M4316 Spondylolisthesis, lumbar region: Secondary | ICD-10-CM | POA: Diagnosis not present

## 2014-12-06 DIAGNOSIS — M545 Low back pain: Secondary | ICD-10-CM | POA: Diagnosis not present

## 2014-12-06 DIAGNOSIS — M5136 Other intervertebral disc degeneration, lumbar region: Secondary | ICD-10-CM | POA: Diagnosis not present

## 2015-01-01 DIAGNOSIS — I5023 Acute on chronic systolic (congestive) heart failure: Secondary | ICD-10-CM | POA: Diagnosis not present

## 2015-02-25 DIAGNOSIS — Z0001 Encounter for general adult medical examination with abnormal findings: Secondary | ICD-10-CM | POA: Diagnosis not present

## 2015-02-25 DIAGNOSIS — J449 Chronic obstructive pulmonary disease, unspecified: Secondary | ICD-10-CM | POA: Diagnosis not present

## 2015-02-25 DIAGNOSIS — E1165 Type 2 diabetes mellitus with hyperglycemia: Secondary | ICD-10-CM | POA: Diagnosis not present

## 2015-02-25 DIAGNOSIS — I2510 Atherosclerotic heart disease of native coronary artery without angina pectoris: Secondary | ICD-10-CM | POA: Diagnosis not present

## 2015-02-25 DIAGNOSIS — Z23 Encounter for immunization: Secondary | ICD-10-CM | POA: Diagnosis not present

## 2015-02-25 DIAGNOSIS — I10 Essential (primary) hypertension: Secondary | ICD-10-CM | POA: Diagnosis not present

## 2015-02-25 DIAGNOSIS — D649 Anemia, unspecified: Secondary | ICD-10-CM | POA: Diagnosis not present

## 2015-02-27 IMAGING — CR DG LUMBAR SPINE COMPLETE 4+V
1 series · 5 of 5 positions shown · non-contrast
Comparison: None.

CLINICAL DATA: Status post fall 1-2 years ago. With persistent back
pain especially when lying supine

EXAM:
LUMBAR SPINE - COMPLETE 4+ VIEW

[Series 1: kdxr lumbar spine with obliques · 0.14mm/px · 5 of 5 slices shown]
[im 1/5]
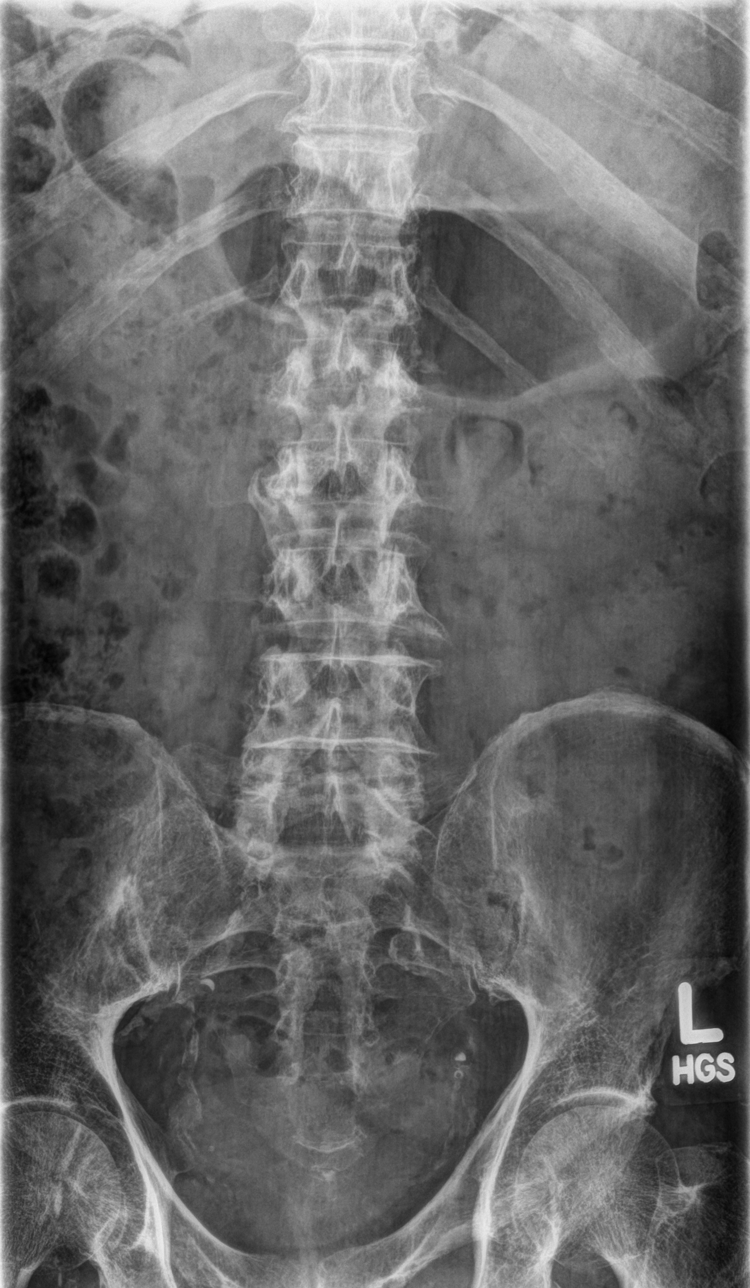
[im 2/5]
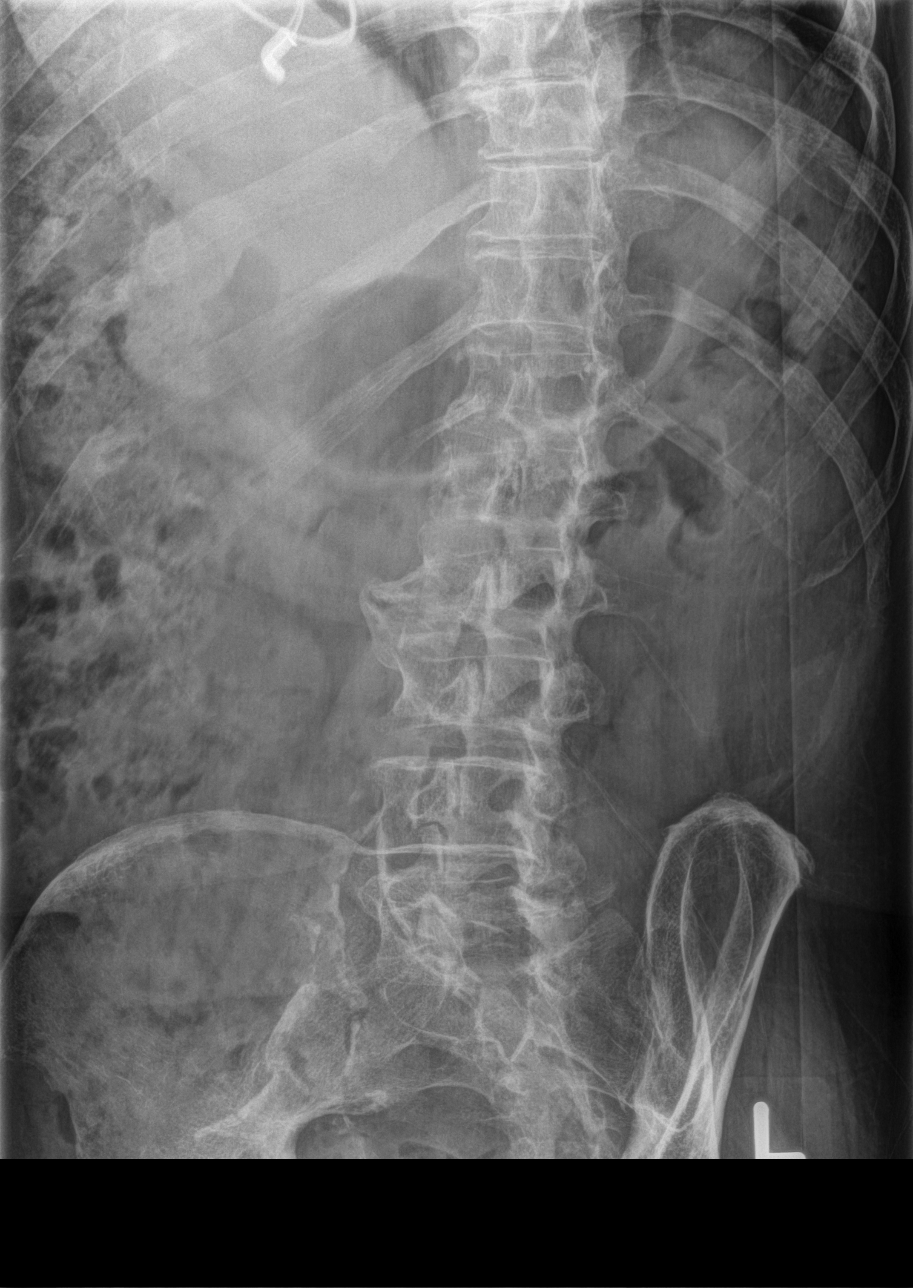
[im 3/5]
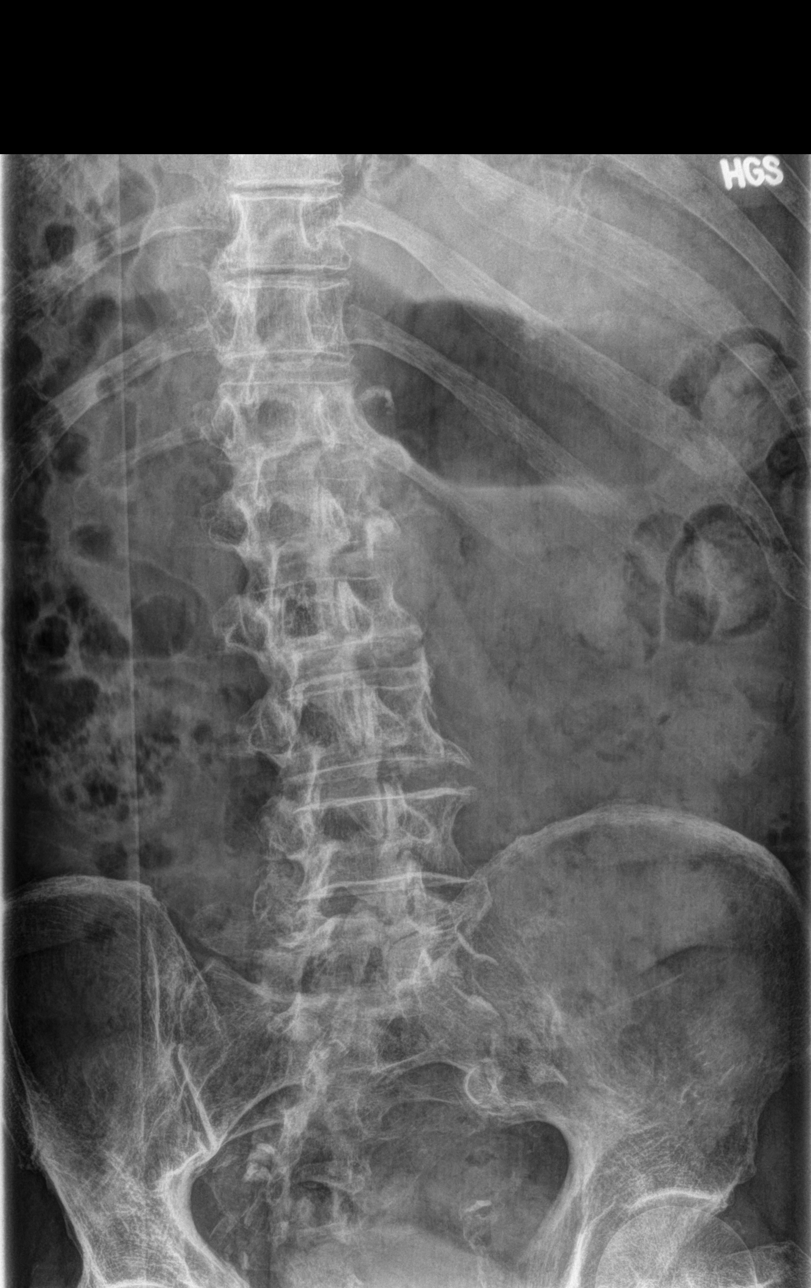
[im 4/5]
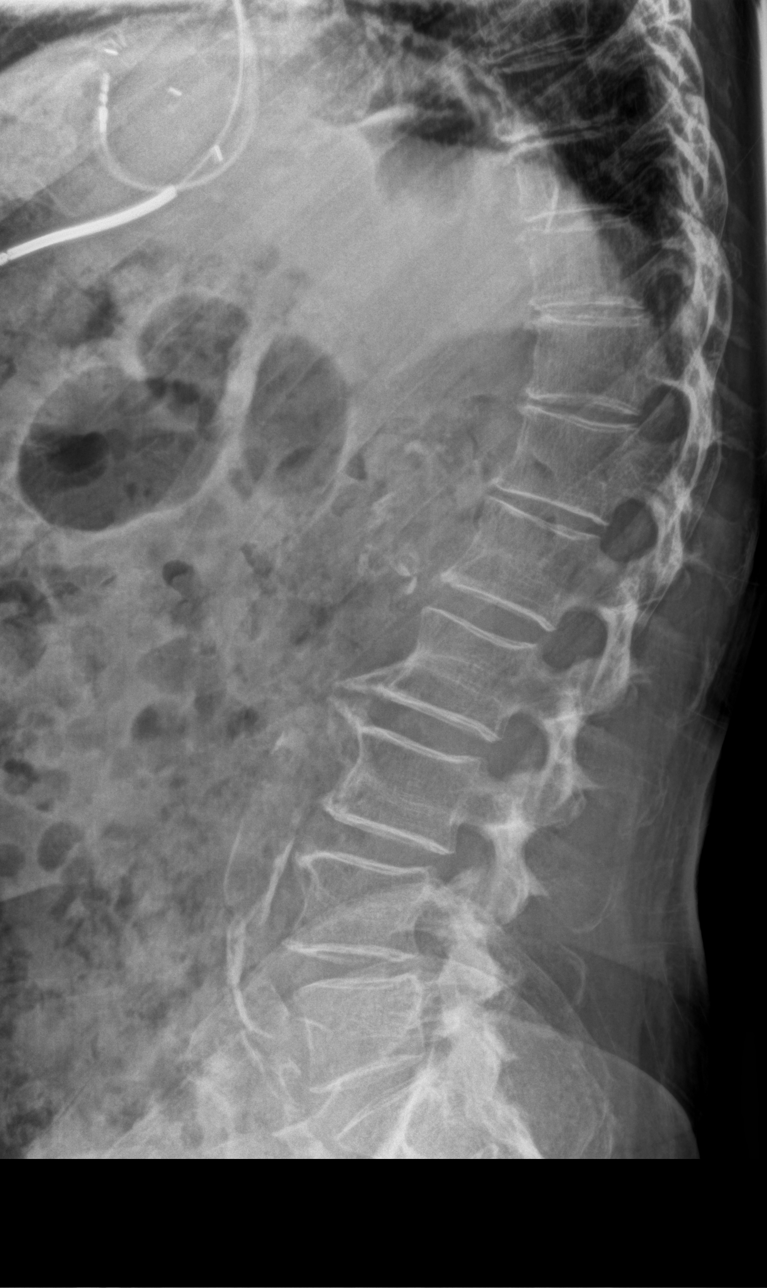
[im 5/5]
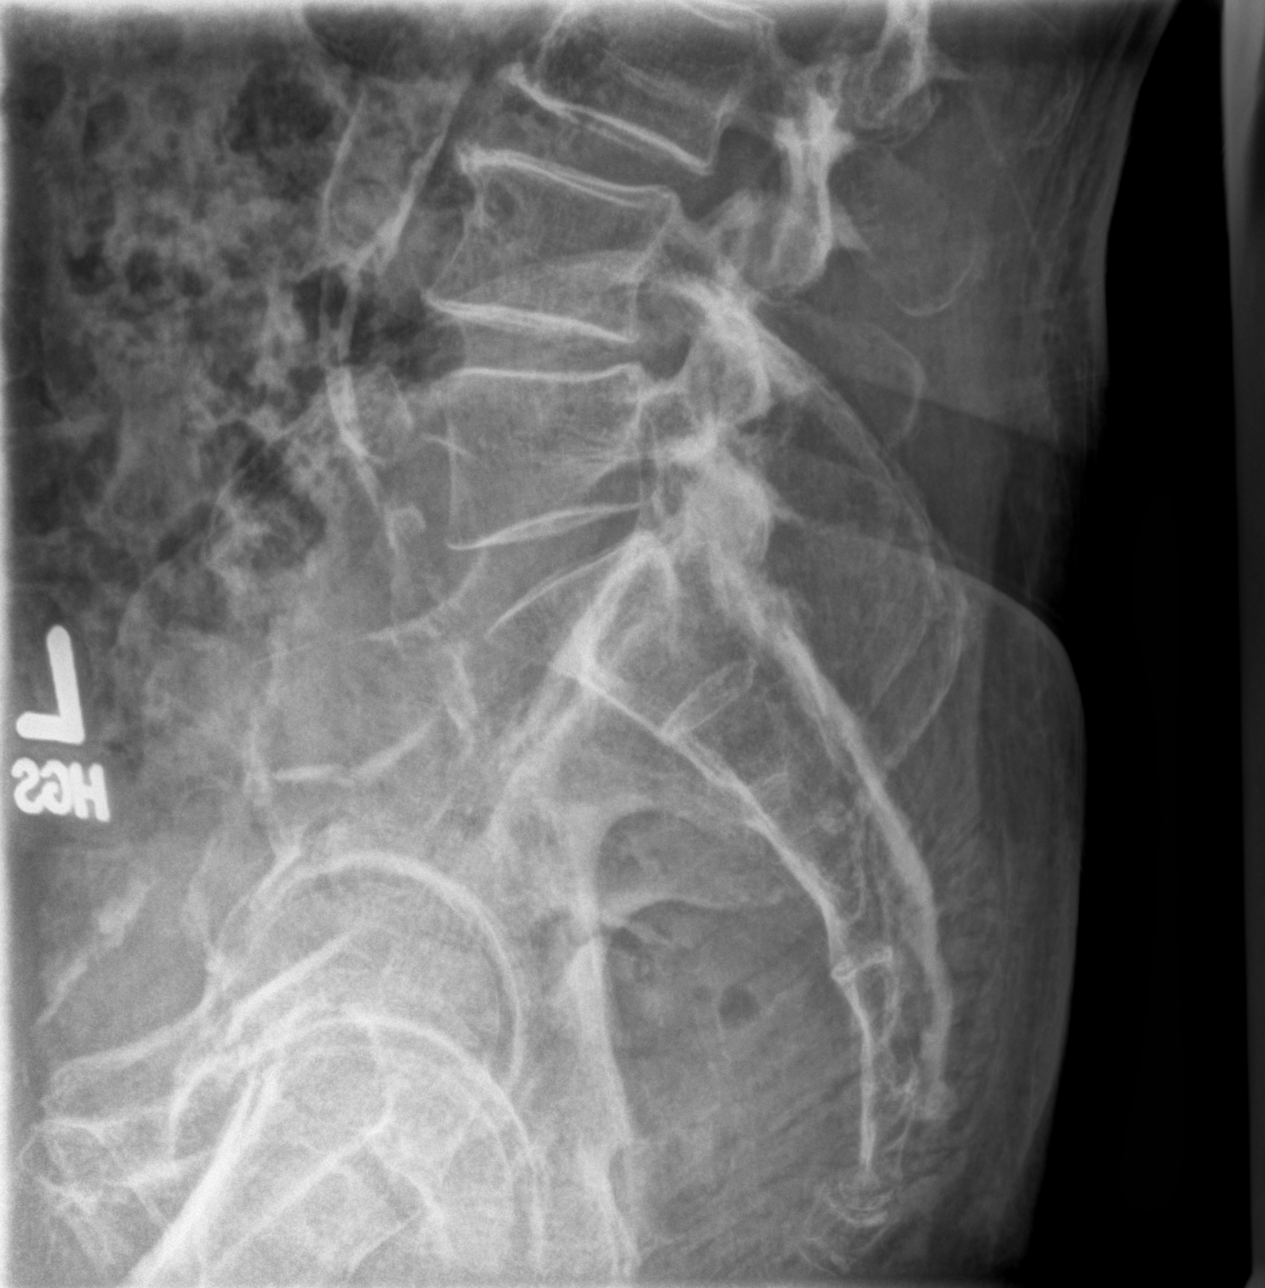

[5 of 5 positions shown; findings below may reference images not displayed]

FINDINGS: The lumbar vertebral bodies are preserved in height. There is mild
disc space narrowing at L4-5. There is no spondylolisthesis. There
is are large anterior near bridging anterior osteophytes at L2-3.
The pedicles and transverse processes are intact where visualized.
There is mild degenerative facet joint change at L4-5 and at L5-S1.
IMPRESSION: There is no acute or chronic lumbar compression. There are mild
degenerative disc and facet joint changes in the lower lumbar spine.
A large anterior near bridging osteophyte at L2-3 is present.

## 2015-02-27 IMAGING — CR DG THORACIC SPINE 2-3V
1 series · 3 of 3 positions shown · non-contrast
Comparison: PA and lateral chest x-ray dated August 09, 2012.

CLINICAL DATA: Status post fall 1-2 years ago with persistent
severe back pain when lying supine

EXAM:
THORACIC SPINE - 2 VIEW

[Series 1: kdxr thoracic ap and lateral · 0.14mm/px · 3 of 3 slices shown]
[im 1/3]
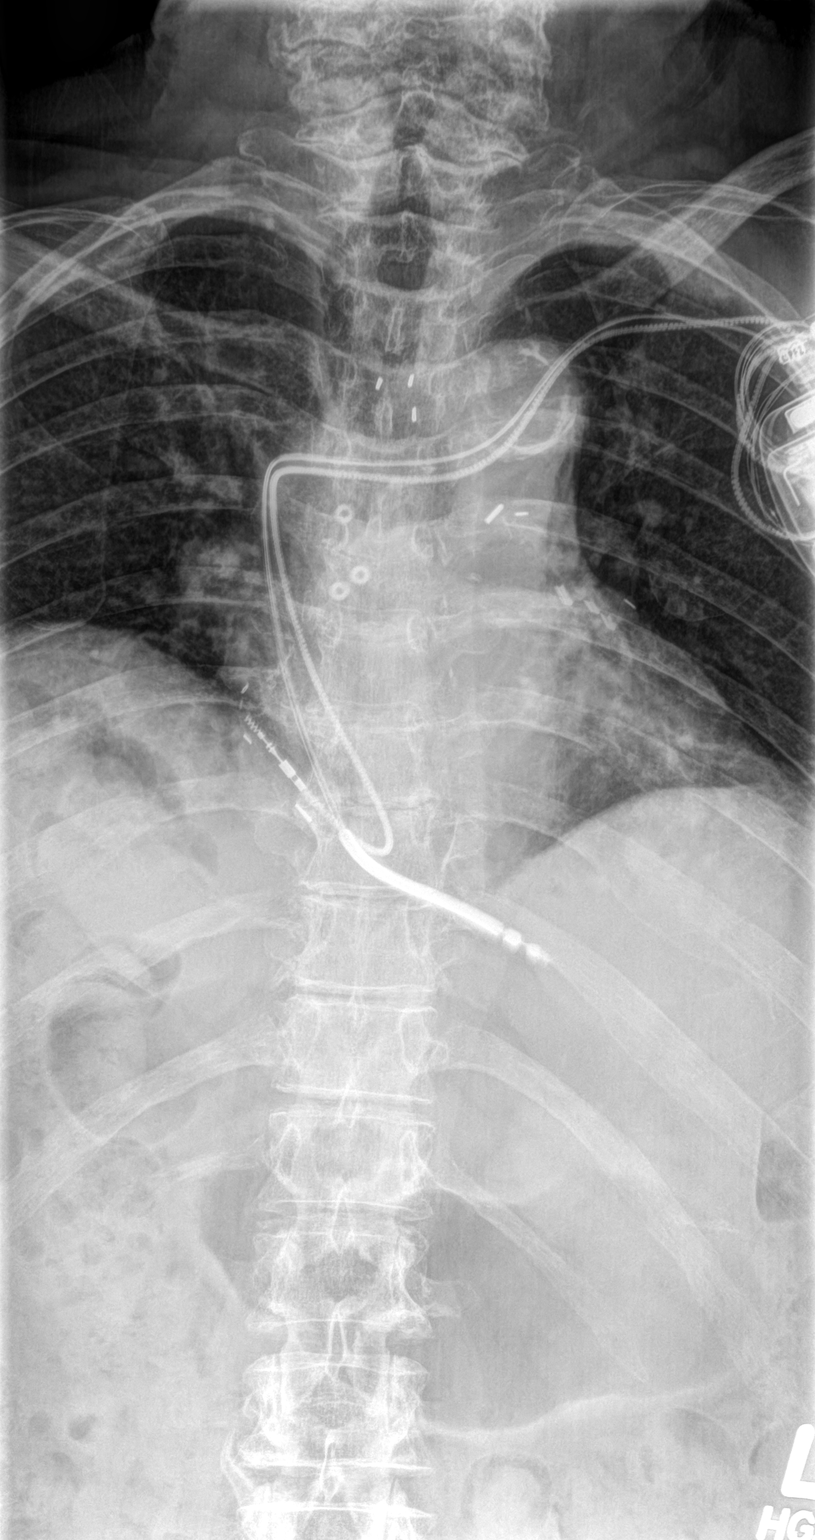
[im 2/3]
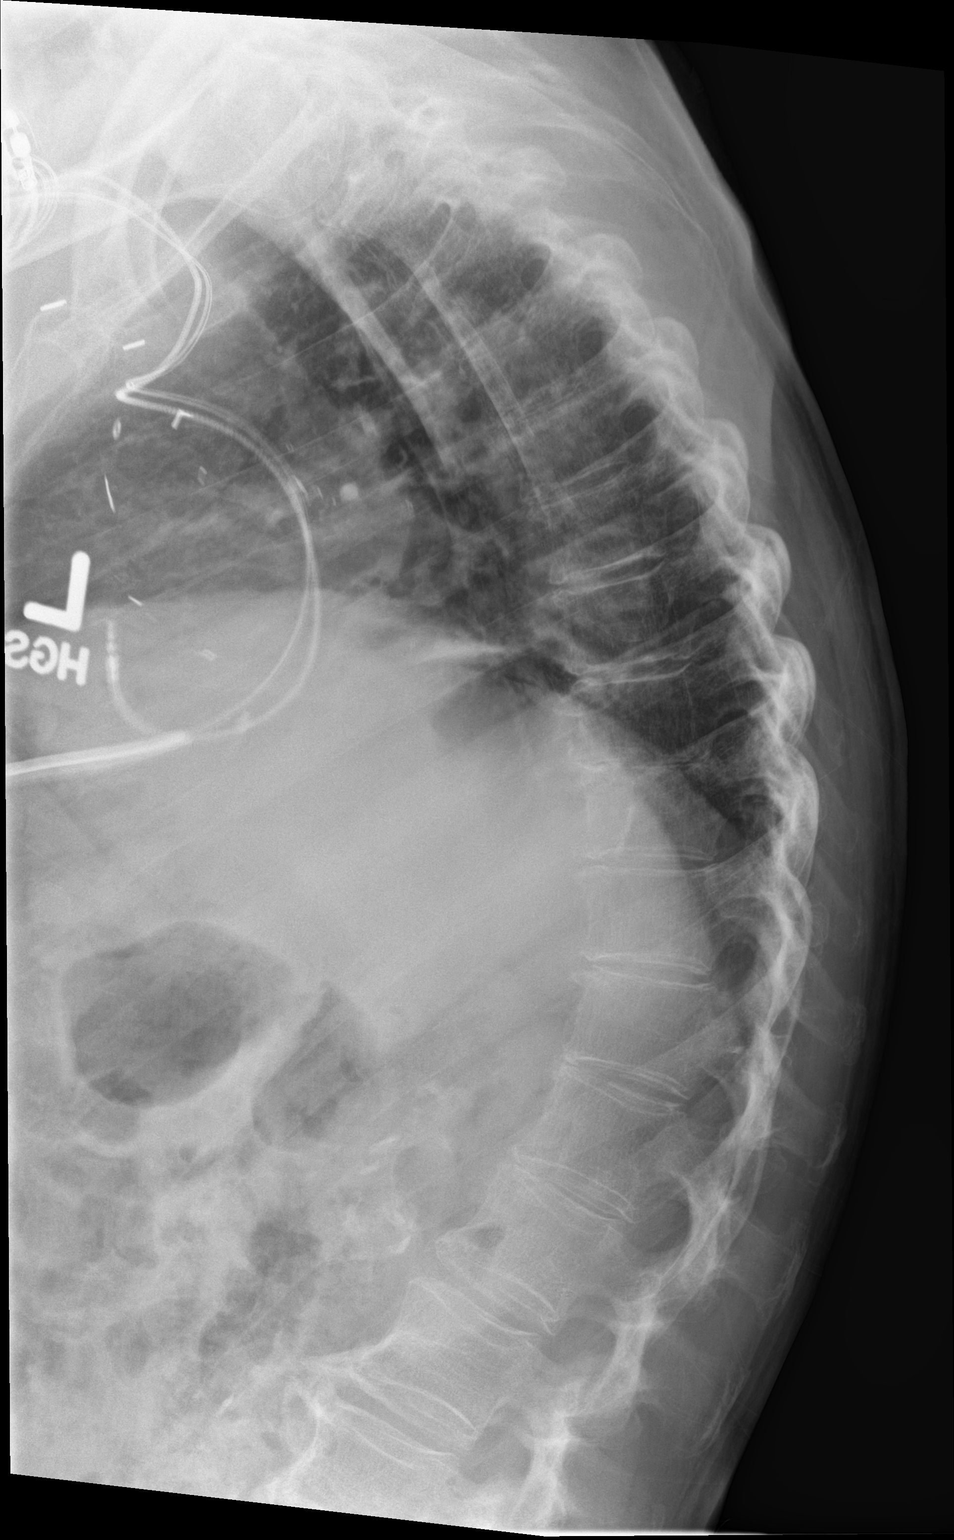
[im 3/3]
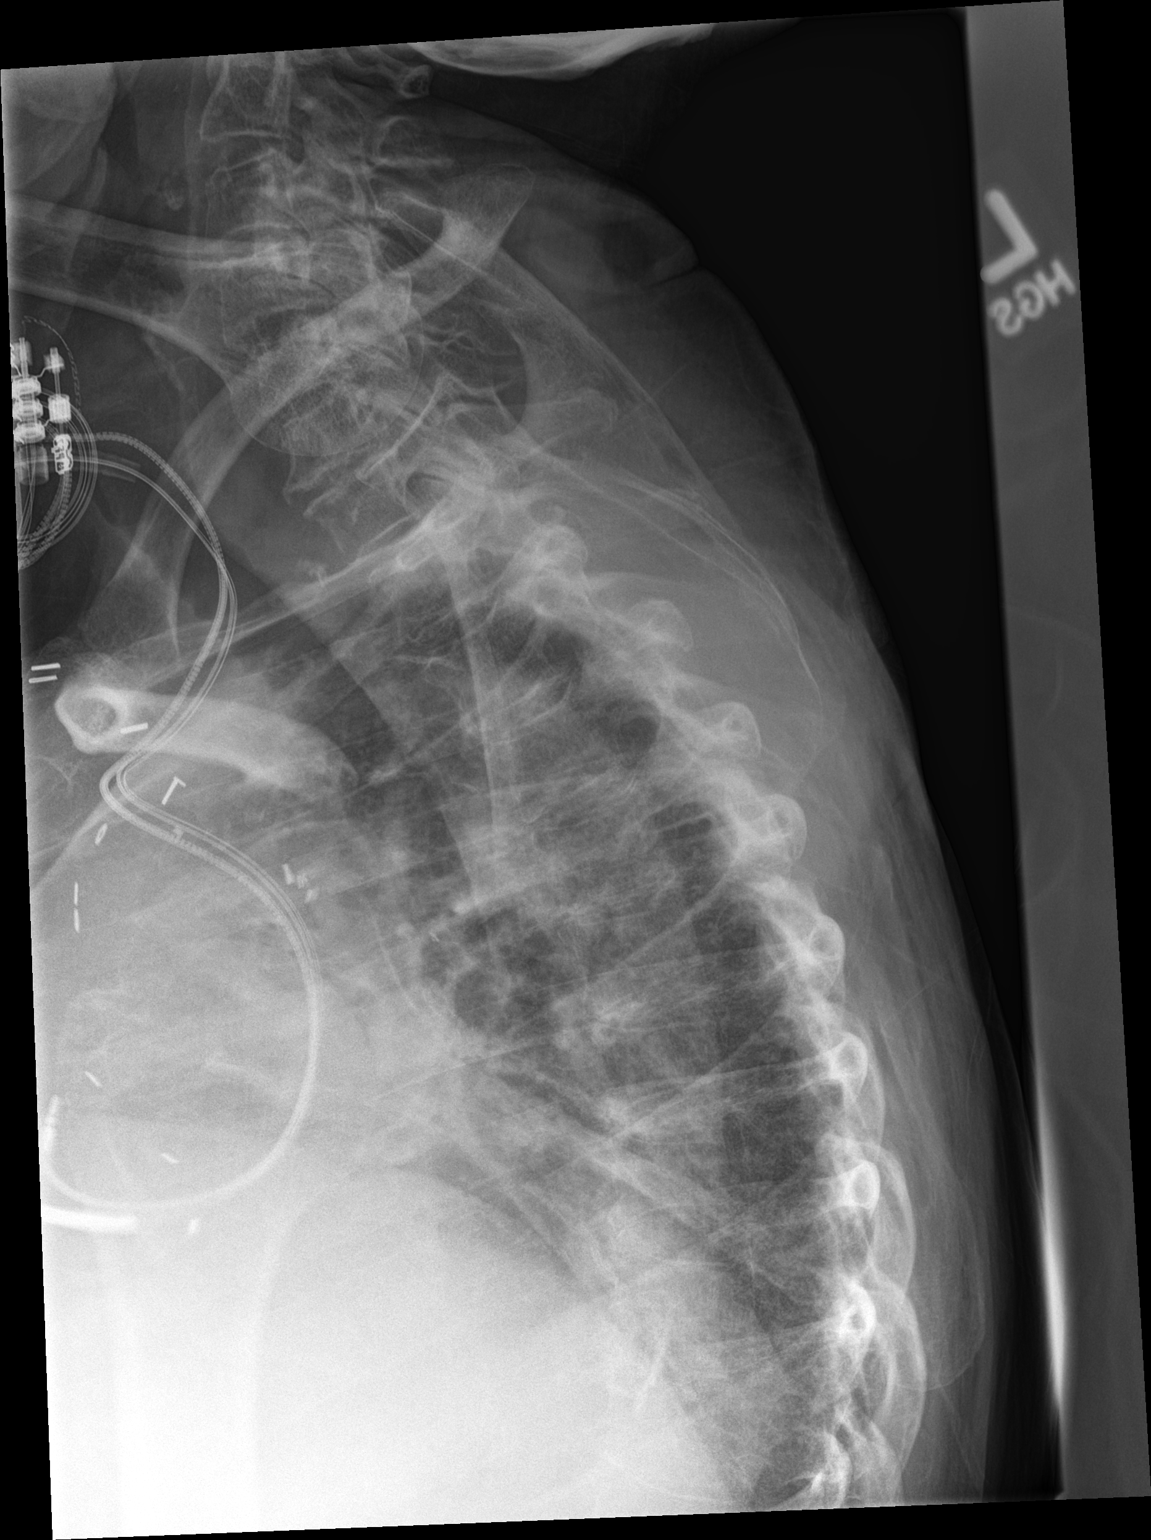

[3 of 3 positions shown; findings below may reference images not displayed]

FINDINGS: The thoracic vertebral bodies are preserved in height. There is mild
degenerative disc space narrowing in the mid and lower thoracic
spine. There are no abnormal paravertebral soft tissue densities.
The pedicles are intact where visualized.
IMPRESSION: There is mild degenerative disc space narrowing in the mid and lower
thoracic spine. There is no compression fracture nor other acute
bony abnormality.

## 2015-03-11 DIAGNOSIS — J432 Centrilobular emphysema: Secondary | ICD-10-CM | POA: Diagnosis not present

## 2015-03-11 DIAGNOSIS — Z9981 Dependence on supplemental oxygen: Secondary | ICD-10-CM | POA: Diagnosis not present

## 2015-03-11 DIAGNOSIS — J449 Chronic obstructive pulmonary disease, unspecified: Secondary | ICD-10-CM | POA: Diagnosis not present

## 2015-04-02 DIAGNOSIS — I5023 Acute on chronic systolic (congestive) heart failure: Secondary | ICD-10-CM | POA: Diagnosis not present

## 2015-04-22 DIAGNOSIS — Z0001 Encounter for general adult medical examination with abnormal findings: Secondary | ICD-10-CM | POA: Diagnosis not present

## 2015-04-22 DIAGNOSIS — Z125 Encounter for screening for malignant neoplasm of prostate: Secondary | ICD-10-CM | POA: Diagnosis not present

## 2015-04-22 DIAGNOSIS — I10 Essential (primary) hypertension: Secondary | ICD-10-CM | POA: Diagnosis not present

## 2015-04-22 DIAGNOSIS — E1165 Type 2 diabetes mellitus with hyperglycemia: Secondary | ICD-10-CM | POA: Diagnosis not present

## 2015-04-22 DIAGNOSIS — E119 Type 2 diabetes mellitus without complications: Secondary | ICD-10-CM | POA: Diagnosis not present

## 2015-07-01 DIAGNOSIS — Z95810 Presence of automatic (implantable) cardiac defibrillator: Secondary | ICD-10-CM | POA: Diagnosis not present

## 2015-07-01 DIAGNOSIS — E1165 Type 2 diabetes mellitus with hyperglycemia: Secondary | ICD-10-CM | POA: Diagnosis not present

## 2015-07-01 DIAGNOSIS — I2510 Atherosclerotic heart disease of native coronary artery without angina pectoris: Secondary | ICD-10-CM | POA: Diagnosis not present

## 2015-07-01 DIAGNOSIS — J432 Centrilobular emphysema: Secondary | ICD-10-CM | POA: Diagnosis not present

## 2015-07-01 DIAGNOSIS — D638 Anemia in other chronic diseases classified elsewhere: Secondary | ICD-10-CM | POA: Diagnosis not present

## 2015-07-02 DIAGNOSIS — I5023 Acute on chronic systolic (congestive) heart failure: Secondary | ICD-10-CM | POA: Diagnosis not present

## 2015-07-15 DIAGNOSIS — R0902 Hypoxemia: Secondary | ICD-10-CM | POA: Diagnosis not present

## 2015-07-15 DIAGNOSIS — J449 Chronic obstructive pulmonary disease, unspecified: Secondary | ICD-10-CM | POA: Diagnosis not present

## 2015-09-16 DIAGNOSIS — B028 Zoster with other complications: Secondary | ICD-10-CM | POA: Diagnosis not present

## 2015-10-01 DIAGNOSIS — I5020 Unspecified systolic (congestive) heart failure: Secondary | ICD-10-CM | POA: Diagnosis not present

## 2015-10-10 DIAGNOSIS — E1165 Type 2 diabetes mellitus with hyperglycemia: Secondary | ICD-10-CM | POA: Diagnosis not present

## 2015-10-10 DIAGNOSIS — B028 Zoster with other complications: Secondary | ICD-10-CM | POA: Diagnosis not present

## 2015-10-29 DIAGNOSIS — D638 Anemia in other chronic diseases classified elsewhere: Secondary | ICD-10-CM | POA: Diagnosis not present

## 2015-10-29 DIAGNOSIS — B028 Zoster with other complications: Secondary | ICD-10-CM | POA: Diagnosis not present

## 2015-10-29 DIAGNOSIS — E1165 Type 2 diabetes mellitus with hyperglycemia: Secondary | ICD-10-CM | POA: Diagnosis not present

## 2015-12-09 DIAGNOSIS — Z0001 Encounter for general adult medical examination with abnormal findings: Secondary | ICD-10-CM | POA: Diagnosis not present

## 2015-12-09 DIAGNOSIS — E782 Mixed hyperlipidemia: Secondary | ICD-10-CM | POA: Diagnosis not present

## 2015-12-09 DIAGNOSIS — B028 Zoster with other complications: Secondary | ICD-10-CM | POA: Diagnosis not present

## 2015-12-09 DIAGNOSIS — J432 Centrilobular emphysema: Secondary | ICD-10-CM | POA: Diagnosis not present

## 2015-12-09 DIAGNOSIS — E1165 Type 2 diabetes mellitus with hyperglycemia: Secondary | ICD-10-CM | POA: Diagnosis not present

## 2015-12-09 DIAGNOSIS — D638 Anemia in other chronic diseases classified elsewhere: Secondary | ICD-10-CM | POA: Diagnosis not present

## 2015-12-31 DIAGNOSIS — I5023 Acute on chronic systolic (congestive) heart failure: Secondary | ICD-10-CM | POA: Diagnosis not present

## 2016-02-12 DIAGNOSIS — I2510 Atherosclerotic heart disease of native coronary artery without angina pectoris: Secondary | ICD-10-CM | POA: Diagnosis not present

## 2016-02-12 DIAGNOSIS — I4891 Unspecified atrial fibrillation: Secondary | ICD-10-CM | POA: Diagnosis not present

## 2016-02-12 DIAGNOSIS — I501 Left ventricular failure: Secondary | ICD-10-CM | POA: Diagnosis not present

## 2016-02-12 DIAGNOSIS — I481 Persistent atrial fibrillation: Secondary | ICD-10-CM | POA: Diagnosis not present

## 2016-02-25 DIAGNOSIS — I2510 Atherosclerotic heart disease of native coronary artery without angina pectoris: Secondary | ICD-10-CM | POA: Diagnosis not present

## 2016-02-25 DIAGNOSIS — Z0001 Encounter for general adult medical examination with abnormal findings: Secondary | ICD-10-CM | POA: Diagnosis not present

## 2016-02-25 DIAGNOSIS — E1165 Type 2 diabetes mellitus with hyperglycemia: Secondary | ICD-10-CM | POA: Diagnosis not present

## 2016-02-25 DIAGNOSIS — J432 Centrilobular emphysema: Secondary | ICD-10-CM | POA: Diagnosis not present

## 2016-02-25 DIAGNOSIS — I10 Essential (primary) hypertension: Secondary | ICD-10-CM | POA: Diagnosis not present

## 2016-02-25 DIAGNOSIS — D638 Anemia in other chronic diseases classified elsewhere: Secondary | ICD-10-CM | POA: Diagnosis not present

## 2016-02-25 DIAGNOSIS — Z23 Encounter for immunization: Secondary | ICD-10-CM | POA: Diagnosis not present

## 2016-02-25 DIAGNOSIS — G4701 Insomnia due to medical condition: Secondary | ICD-10-CM | POA: Diagnosis not present

## 2016-03-31 DIAGNOSIS — I5023 Acute on chronic systolic (congestive) heart failure: Secondary | ICD-10-CM | POA: Diagnosis not present

## 2016-06-19 DIAGNOSIS — I7090 Unspecified atherosclerosis: Secondary | ICD-10-CM | POA: Diagnosis not present

## 2016-06-19 DIAGNOSIS — Z951 Presence of aortocoronary bypass graft: Secondary | ICD-10-CM | POA: Diagnosis not present

## 2016-06-19 DIAGNOSIS — I10 Essential (primary) hypertension: Secondary | ICD-10-CM | POA: Diagnosis not present

## 2016-06-19 DIAGNOSIS — I498 Other specified cardiac arrhythmias: Secondary | ICD-10-CM | POA: Diagnosis not present

## 2016-06-19 DIAGNOSIS — I509 Heart failure, unspecified: Secondary | ICD-10-CM | POA: Diagnosis not present

## 2016-06-19 DIAGNOSIS — I2510 Atherosclerotic heart disease of native coronary artery without angina pectoris: Secondary | ICD-10-CM | POA: Diagnosis not present

## 2016-06-19 DIAGNOSIS — E785 Hyperlipidemia, unspecified: Secondary | ICD-10-CM | POA: Diagnosis not present

## 2016-06-19 DIAGNOSIS — I4901 Ventricular fibrillation: Secondary | ICD-10-CM | POA: Diagnosis not present
